# Patient Record
Sex: Female | Born: 1960 | Race: White | Hispanic: No | Marital: Single | Smoking: Former smoker
Health system: Southern US, Community
[De-identification: ages and names within clinical notes are randomized; demographics above are authoritative.]

## PROBLEM LIST (undated history)

## (undated) DIAGNOSIS — F329 Major depressive disorder, single episode, unspecified: Secondary | ICD-10-CM

## (undated) DIAGNOSIS — F419 Anxiety disorder, unspecified: Secondary | ICD-10-CM

## (undated) DIAGNOSIS — F32A Depression, unspecified: Secondary | ICD-10-CM

## (undated) DIAGNOSIS — Z803 Family history of malignant neoplasm of breast: Secondary | ICD-10-CM

## (undated) DIAGNOSIS — M1990 Unspecified osteoarthritis, unspecified site: Secondary | ICD-10-CM

## (undated) DIAGNOSIS — K635 Polyp of colon: Secondary | ICD-10-CM

## (undated) DIAGNOSIS — Z8051 Family history of malignant neoplasm of kidney: Secondary | ICD-10-CM

## (undated) HISTORY — PX: DIAGNOSTIC LAPAROSCOPY: SUR761

## (undated) HISTORY — PX: WISDOM TOOTH EXTRACTION: SHX21

## (undated) HISTORY — PX: TONSILLECTOMY: SUR1361

## (undated) HISTORY — DX: Family history of malignant neoplasm of breast: Z80.3

## (undated) HISTORY — DX: Family history of malignant neoplasm of kidney: Z80.51

## (undated) HISTORY — DX: Polyp of colon: K63.5

## (undated) HISTORY — PX: APPENDECTOMY: SHX54

## (undated) HISTORY — PX: SHOULDER ARTHROSCOPY: SHX128

---

## 2003-02-10 ENCOUNTER — Encounter (INDEPENDENT_AMBULATORY_CARE_PROVIDER_SITE_OTHER): Payer: Self-pay | Admitting: *Deleted

## 2003-02-10 ENCOUNTER — Ambulatory Visit (HOSPITAL_COMMUNITY): Admission: RE | Admit: 2003-02-10 | Discharge: 2003-02-10 | Payer: Self-pay | Admitting: *Deleted

## 2008-06-25 ENCOUNTER — Encounter: Admission: RE | Admit: 2008-06-25 | Discharge: 2008-06-25 | Payer: Self-pay | Admitting: Orthopedic Surgery

## 2009-08-29 HISTORY — PX: OTHER SURGICAL HISTORY: SHX169

## 2011-02-13 ENCOUNTER — Emergency Department (HOSPITAL_COMMUNITY)
Admission: EM | Admit: 2011-02-13 | Discharge: 2011-02-14 | Disposition: A | Discharge status: Other Acute Inpatient Hospital | Payer: BC Managed Care – PPO | Attending: Emergency Medicine | Admitting: Emergency Medicine

## 2011-02-13 DIAGNOSIS — F3289 Other specified depressive episodes: Secondary | ICD-10-CM | POA: Insufficient documentation

## 2011-02-13 DIAGNOSIS — T24339A Burn of third degree of unspecified lower leg, initial encounter: Secondary | ICD-10-CM | POA: Insufficient documentation

## 2011-02-13 DIAGNOSIS — X088XXA Exposure to other specified smoke, fire and flames, initial encounter: Secondary | ICD-10-CM | POA: Insufficient documentation

## 2011-02-13 DIAGNOSIS — M7989 Other specified soft tissue disorders: Secondary | ICD-10-CM | POA: Insufficient documentation

## 2011-02-13 DIAGNOSIS — M79609 Pain in unspecified limb: Secondary | ICD-10-CM | POA: Insufficient documentation

## 2011-02-13 DIAGNOSIS — F329 Major depressive disorder, single episode, unspecified: Secondary | ICD-10-CM | POA: Insufficient documentation

## 2011-02-14 LAB — CBC
HCT: 34.1 % — ABNORMAL LOW (ref 36.0–46.0)
MCV: 82.4 fL (ref 78.0–100.0)
Platelets: 331 10*3/uL (ref 150–400)
RBC: 4.14 MIL/uL (ref 3.87–5.11)
RDW: 12.7 % (ref 11.5–15.5)
WBC: 10.6 10*3/uL — ABNORMAL HIGH (ref 4.0–10.5)

## 2011-02-14 LAB — DIFFERENTIAL
Basophils Absolute: 0 10*3/uL (ref 0.0–0.1)
Basophils Relative: 0 % (ref 0–1)
Lymphocytes Relative: 17 % (ref 12–46)
Lymphs Abs: 1.8 10*3/uL (ref 0.7–4.0)

## 2011-02-14 LAB — COMPREHENSIVE METABOLIC PANEL
ALT: 47 U/L — ABNORMAL HIGH (ref 0–35)
AST: 31 U/L (ref 0–37)
Albumin: 3 g/dL — ABNORMAL LOW (ref 3.5–5.2)
Alkaline Phosphatase: 123 U/L — ABNORMAL HIGH (ref 39–117)
BUN: 15 mg/dL (ref 6–23)
CO2: 26 mEq/L (ref 19–32)
Total Bilirubin: 0.2 mg/dL — ABNORMAL LOW (ref 0.3–1.2)
Total Protein: 6.7 g/dL (ref 6.0–8.3)

## 2012-02-20 ENCOUNTER — Encounter (HOSPITAL_COMMUNITY): Payer: Self-pay | Admitting: Pharmacy Technician

## 2012-02-28 ENCOUNTER — Other Ambulatory Visit (HOSPITAL_COMMUNITY): Payer: BC Managed Care – PPO

## 2012-02-29 ENCOUNTER — Encounter (HOSPITAL_COMMUNITY): Payer: Self-pay

## 2012-02-29 ENCOUNTER — Encounter (HOSPITAL_COMMUNITY)
Admission: RE | Admit: 2012-02-29 | Discharge: 2012-02-29 | Disposition: A | Discharge status: Home / Self Care | Payer: BC Managed Care – PPO | Source: Ambulatory Visit | Attending: Obstetrics | Admitting: Obstetrics

## 2012-02-29 HISTORY — DX: Depression, unspecified: F32.A

## 2012-02-29 HISTORY — DX: Major depressive disorder, single episode, unspecified: F32.9

## 2012-02-29 LAB — CBC
Hemoglobin: 12.2 g/dL (ref 12.0–15.0)
MCH: 28.5 pg (ref 26.0–34.0)
Platelets: 251 10*3/uL (ref 150–400)
WBC: 8.3 10*3/uL (ref 4.0–10.5)

## 2012-02-29 NOTE — Patient Instructions (Addendum)
YOUR PROCEDURE IS SCHEDULED ON:03/06/12  ENTER THROUGH THE MAIN ENTRANCE OF [facility] AT: 6am  USE DESK PHONE AND DIAL [phone] TO INFORM us OF YOUR ARRIVAL  CALL [phone] IF YOU HAVE ANY QUESTIONS OR PROBLEMS PRIOR TO YOUR ARRIVAL.  REMEMBER: DO NOT EAT OR DRINK AFTER MIDNIGHT :Monday   YOU MAY BRUSH YOUR TEETH THE MORNING OF SURGERY   TAKE THESE MEDICINES THE DAY OF SURGERY WITH SIP OF WATER:none   DO NOT WEAR JEWELRY, EYE MAKEUP, LIPSTICK OR DARK FINGERNAIL POLISH DO NOT WEAR LOTIONS  DO NOT SHAVE FOR 48 HOURS PRIOR TO SURGERY  YOU WILL NOT BE ALLOWED TO DRIVE YOURSELF HOME.

## 2012-03-02 ENCOUNTER — Other Ambulatory Visit: Payer: Self-pay | Admitting: Obstetrics

## 2012-03-06 ENCOUNTER — Encounter (HOSPITAL_COMMUNITY): Payer: Self-pay | Admitting: Anesthesiology

## 2012-03-06 ENCOUNTER — Encounter (HOSPITAL_COMMUNITY): Payer: Self-pay | Admitting: *Deleted

## 2012-03-06 ENCOUNTER — Encounter (HOSPITAL_COMMUNITY)
Admission: RE | Disposition: A | Discharge status: Home / Self Care | Payer: Self-pay | Source: Ambulatory Visit | Attending: Obstetrics

## 2012-03-06 ENCOUNTER — Ambulatory Visit (HOSPITAL_COMMUNITY)
Admission: RE | Admit: 2012-03-06 | Discharge: 2012-03-06 | Disposition: A | Discharge status: Home / Self Care | Payer: BC Managed Care – PPO | Source: Ambulatory Visit | Attending: Obstetrics | Admitting: Obstetrics

## 2012-03-06 ENCOUNTER — Ambulatory Visit (HOSPITAL_COMMUNITY): Payer: BC Managed Care – PPO | Admitting: Anesthesiology

## 2012-03-06 DIAGNOSIS — Z01812 Encounter for preprocedural laboratory examination: Secondary | ICD-10-CM | POA: Insufficient documentation

## 2012-03-06 DIAGNOSIS — N393 Stress incontinence (female) (male): Secondary | ICD-10-CM | POA: Insufficient documentation

## 2012-03-06 DIAGNOSIS — N3641 Hypermobility of urethra: Secondary | ICD-10-CM | POA: Insufficient documentation

## 2012-03-06 DIAGNOSIS — Z01818 Encounter for other preprocedural examination: Secondary | ICD-10-CM | POA: Insufficient documentation

## 2012-03-06 HISTORY — PX: CYSTOSCOPY: SHX5120

## 2012-03-06 HISTORY — PX: BLADDER SUSPENSION: SHX72

## 2012-03-06 SURGERY — TRANSVAGINAL TAPE (TVT) PROCEDURE
Anesthesia: General | Site: Urethra | Wound class: Clean Contaminated

## 2012-03-06 MED ORDER — LIDOCAINE HCL (CARDIAC) 20 MG/ML IV SOLN
INTRAVENOUS | Status: AC
  Filled 2012-03-06: qty 5

## 2012-03-06 MED ORDER — STERILE WATER FOR IRRIGATION IR SOLN
Status: DC | PRN
  Administered 2012-03-06: 1000 mL via INTRAVESICAL

## 2012-03-06 MED ORDER — SODIUM CHLORIDE 0.9 % IJ SOLN
INTRAMUSCULAR | Status: DC | PRN
  Administered 2012-03-06: 50 mL

## 2012-03-06 MED ORDER — FENTANYL CITRATE 0.05 MG/ML IJ SOLN
INTRAMUSCULAR | Status: AC
  Filled 2012-03-06: qty 2

## 2012-03-06 MED ORDER — MEPERIDINE HCL 25 MG/ML IJ SOLN: 6.2500 mg | INTRAMUSCULAR | Status: DC | PRN

## 2012-03-06 MED ORDER — NITROFURANTOIN MONOHYD MACRO 100 MG PO CAPS: 100.0000 mg | ORAL_CAPSULE | Freq: Every day | ORAL | Status: AC

## 2012-03-06 MED ORDER — INDIGOTINDISULFONATE SODIUM 8 MG/ML IJ SOLN
INTRAMUSCULAR | Status: AC
  Filled 2012-03-06: qty 5

## 2012-03-06 MED ORDER — GLYCOPYRROLATE 0.2 MG/ML IJ SOLN
INTRAMUSCULAR | Status: AC
  Filled 2012-03-06: qty 1

## 2012-03-06 MED ORDER — PROPOFOL 10 MG/ML IV EMUL
INTRAVENOUS | Status: DC | PRN
  Administered 2012-03-06: 180 mg via INTRAVENOUS

## 2012-03-06 MED ORDER — DEXAMETHASONE SODIUM PHOSPHATE 4 MG/ML IJ SOLN
INTRAMUSCULAR | Status: DC | PRN
  Administered 2012-03-06: 10 mg via INTRAVENOUS

## 2012-03-06 MED ORDER — ONDANSETRON HCL 4 MG/2ML IJ SOLN
INTRAMUSCULAR | Status: AC
  Filled 2012-03-06: qty 2

## 2012-03-06 MED ORDER — PROMETHAZINE HCL 25 MG/ML IJ SOLN: 6.2500 mg | INTRAMUSCULAR | Status: DC | PRN

## 2012-03-06 MED ORDER — IBUPROFEN 600 MG PO TABS: 600.0000 mg | ORAL_TABLET | Freq: Four times a day (QID) | ORAL | Status: DC | PRN

## 2012-03-06 MED ORDER — OXYCODONE-ACETAMINOPHEN 5-325 MG PO TABS: 1.0000 | ORAL_TABLET | ORAL | Status: DC | PRN

## 2012-03-06 MED ORDER — MIDAZOLAM HCL 2 MG/2ML IJ SOLN
INTRAMUSCULAR | Status: AC
  Filled 2012-03-06: qty 2

## 2012-03-06 MED ORDER — CEFAZOLIN SODIUM-DEXTROSE 2-3 GM-% IV SOLR
INTRAVENOUS | Status: AC
  Administered 2012-03-06: 2 g via INTRAVENOUS
  Filled 2012-03-06: qty 50

## 2012-03-06 MED ORDER — MIDAZOLAM HCL 2 MG/2ML IJ SOLN: 0.5000 mg | Freq: Once | INTRAMUSCULAR | Status: DC | PRN

## 2012-03-06 MED ORDER — ZOLPIDEM TARTRATE 5 MG PO TABS: 5.0000 mg | ORAL_TABLET | Freq: Every evening | ORAL | Status: DC | PRN

## 2012-03-06 MED ORDER — SODIUM CHLORIDE 0.9 % IJ SOLN
INTRAMUSCULAR | Status: DC | PRN
  Administered 2012-03-06: 10 mL

## 2012-03-06 MED ORDER — GLYCOPYRROLATE 0.2 MG/ML IJ SOLN
INTRAMUSCULAR | Status: DC | PRN
  Administered 2012-03-06 (×2): 0.1 mg via INTRAVENOUS

## 2012-03-06 MED ORDER — FENTANYL CITRATE 0.05 MG/ML IJ SOLN: 25.0000 ug | INTRAMUSCULAR | Status: DC | PRN

## 2012-03-06 MED ORDER — ACETAMINOPHEN 325 MG PO TABS: 325.0000 mg | ORAL_TABLET | ORAL | Status: DC | PRN

## 2012-03-06 MED ORDER — IBUPROFEN 200 MG PO TABS: 800.0000 mg | ORAL_TABLET | Freq: Three times a day (TID) | ORAL | Status: AC | PRN

## 2012-03-06 MED ORDER — MIDAZOLAM HCL 5 MG/5ML IJ SOLN
INTRAMUSCULAR | Status: DC | PRN
  Administered 2012-03-06: 2 mg via INTRAVENOUS

## 2012-03-06 MED ORDER — FENTANYL CITRATE 0.05 MG/ML IJ SOLN
INTRAMUSCULAR | Status: DC | PRN
  Administered 2012-03-06: 100 ug via INTRAVENOUS

## 2012-03-06 MED ORDER — ONDANSETRON HCL 4 MG/2ML IJ SOLN: 4.0000 mg | Freq: Four times a day (QID) | INTRAMUSCULAR | Status: DC | PRN

## 2012-03-06 MED ORDER — LACTATED RINGERS IV SOLN
INTRAVENOUS | Status: DC
  Administered 2012-03-06: 125 mL/h via INTRAVENOUS
  Administered 2012-03-06: 08:00:00 via INTRAVENOUS

## 2012-03-06 MED ORDER — BUPIVACAINE-EPINEPHRINE 0.5% -1:200000 IJ SOLN
INTRAMUSCULAR | Status: DC | PRN
  Administered 2012-03-06: 30 mL

## 2012-03-06 MED ORDER — OXYCODONE-ACETAMINOPHEN 5-325 MG PO TABS: 2.0000 | ORAL_TABLET | Freq: Four times a day (QID) | ORAL | Status: AC | PRN

## 2012-03-06 MED ORDER — HYDROMORPHONE HCL PF 1 MG/ML IJ SOLN
1.0000 mg | INTRAMUSCULAR | Status: DC | PRN
  Administered 2012-03-06: 1 mg via INTRAVENOUS
  Filled 2012-03-06: qty 1

## 2012-03-06 MED ORDER — DEXAMETHASONE SODIUM PHOSPHATE 10 MG/ML IJ SOLN
INTRAMUSCULAR | Status: AC
  Filled 2012-03-06: qty 1

## 2012-03-06 MED ORDER — HEMOSTATIC AGENTS (NO CHARGE) OPTIME
TOPICAL | Status: DC | PRN
  Administered 2012-03-06: 1 via TOPICAL

## 2012-03-06 MED ORDER — BACITRACIN ZINC 500 UNIT/GM EX OINT
TOPICAL_OINTMENT | CUTANEOUS | Status: AC
  Filled 2012-03-06: qty 15

## 2012-03-06 MED ORDER — LIDOCAINE HCL (CARDIAC) 20 MG/ML IV SOLN
INTRAVENOUS | Status: DC | PRN
  Administered 2012-03-06: 80 mg via INTRAVENOUS

## 2012-03-06 MED ORDER — MENTHOL 3 MG MT LOZG: 1.0000 | LOZENGE | OROMUCOSAL | Status: DC | PRN

## 2012-03-06 MED ORDER — SODIUM CHLORIDE 0.9 % IV SOLN
INTRAVENOUS | Status: DC
  Administered 2012-03-06: 13:00:00 via INTRAVENOUS

## 2012-03-06 MED ORDER — BACITRACIN ZINC 500 UNIT/GM EX OINT
TOPICAL_OINTMENT | CUTANEOUS | Status: DC | PRN
  Administered 2012-03-06: 1 via TOPICAL

## 2012-03-06 MED ORDER — PANTOPRAZOLE SODIUM 40 MG PO TBEC
40.0000 mg | DELAYED_RELEASE_TABLET | Freq: Every day | ORAL | Status: DC
  Filled 2012-03-06 (×2): qty 1

## 2012-03-06 MED ORDER — GELATIN ABSORBABLE 12-7 MM EX MISC
CUTANEOUS | Status: AC
  Filled 2012-03-06: qty 1

## 2012-03-06 MED ORDER — BUPIVACAINE-EPINEPHRINE (PF) 0.5% -1:200000 IJ SOLN
INTRAMUSCULAR | Status: AC
  Filled 2012-03-06: qty 10

## 2012-03-06 MED ORDER — KETOROLAC TROMETHAMINE 30 MG/ML IJ SOLN: 15.0000 mg | Freq: Once | INTRAMUSCULAR | Status: DC | PRN

## 2012-03-06 MED ORDER — ONDANSETRON HCL 4 MG/2ML IJ SOLN
INTRAMUSCULAR | Status: DC | PRN
  Administered 2012-03-06: 4 mg via INTRAVENOUS

## 2012-03-06 MED ORDER — ONDANSETRON HCL 4 MG PO TABS: 4.0000 mg | ORAL_TABLET | Freq: Four times a day (QID) | ORAL | Status: DC | PRN

## 2012-03-06 MED ORDER — PROPOFOL 10 MG/ML IV EMUL
INTRAVENOUS | Status: AC
  Filled 2012-03-06: qty 20

## 2012-03-06 SURGICAL SUPPLY — 37 items
BLADE SURG 15 STRL LF C SS BP (BLADE) ×2 IMPLANT
BLADE SURG 15 STRL SS (BLADE) ×1
CANISTER SUCTION 2500CC (MISCELLANEOUS) ×3 IMPLANT
CATH FOLEY 3WAY 30CC 18FR (CATHETERS) IMPLANT
CLOTH BEACON ORANGE TIMEOUT ST (SAFETY) ×3 IMPLANT
COUNTER NEEDLE 1200 MAGNETIC (NEEDLE) ×3 IMPLANT
DECANTER SPIKE VIAL GLASS SM (MISCELLANEOUS) ×3 IMPLANT
DERMABOND ADHESIVE PROPEN (GAUZE/BANDAGES/DRESSINGS) ×1
DERMABOND ADVANCED (GAUZE/BANDAGES/DRESSINGS) ×2
DERMABOND ADVANCED .7 DNX12 (GAUZE/BANDAGES/DRESSINGS) ×4 IMPLANT
DERMABOND ADVANCED .7 DNX6 (GAUZE/BANDAGES/DRESSINGS) ×2 IMPLANT
DRAPE HYSTEROSCOPY (DRAPE) ×3 IMPLANT
GAUZE PACKING 1 X5 YD ST (GAUZE/BANDAGES/DRESSINGS) ×3 IMPLANT
GAUZE PACKING IODOFORM 2 (PACKING) IMPLANT
GLOVE BIO SURGEON STRL SZ 6.5 (GLOVE) ×3 IMPLANT
GLOVE BIOGEL PI IND STRL 6.5 (GLOVE) ×2 IMPLANT
GLOVE BIOGEL PI IND STRL 7.0 (GLOVE) ×4 IMPLANT
GLOVE BIOGEL PI INDICATOR 6.5 (GLOVE) ×1
GLOVE BIOGEL PI INDICATOR 7.0 (GLOVE) ×2
GOWN PREVENTION PLUS LG XLONG (DISPOSABLE) ×12 IMPLANT
NEEDLE HYPO 22GX1.5 SAFETY (NEEDLE) ×3 IMPLANT
NEEDLE SPNL 20GX3.5 QUINCKE YW (NEEDLE) ×3 IMPLANT
NS IRRIG 1000ML POUR BTL (IV SOLUTION) ×3 IMPLANT
PACK VAGINAL MINOR WOMEN LF (CUSTOM PROCEDURE TRAY) ×3 IMPLANT
PACK VAGINAL WOMENS (CUSTOM PROCEDURE TRAY) IMPLANT
PLUG CATH AND CAP STER (CATHETERS) ×3 IMPLANT
SET CYSTO W/LG BORE CLAMP LF (SET/KITS/TRAYS/PACK) ×3 IMPLANT
SLING TRANS VAGINAL TAPE (Sling) ×1 IMPLANT
SLING UTERINE/ABD GYNECARE TVT (Sling) ×2 IMPLANT
SUT VIC AB 2-0 CT1 27 (SUTURE) ×1
SUT VIC AB 2-0 CT1 TAPERPNT 27 (SUTURE) ×2 IMPLANT
SUT VIC AB 2-0 UR5 27 (SUTURE) ×3 IMPLANT
SYR BULB IRRIGATION 50ML (SYRINGE) ×3 IMPLANT
SYRINGE 10CC LL (SYRINGE) IMPLANT
TOWEL OR 17X24 6PK STRL BLUE (TOWEL DISPOSABLE) ×6 IMPLANT
TRAY FOLEY BAG SILVER LF 16FR (CATHETERS) ×3 IMPLANT
WATER STERILE IRR 1000ML POUR (IV SOLUTION) ×3 IMPLANT

## 2012-03-06 NOTE — Op Note (Signed)
Preoperative diagnosis: Stress urinary incontinence, urethral hypermobility  Postoperative diagnosis: stress urinary incontinence, urethral hypermobility Procedure tension-free vaginal tape sling placement, retropubic placement, cystoscopy Surgeon: Ernestina Penna Assistant none Anesthesia Gen. endotracheal anesthesia Antibiotics 1 g Ancef Complications: None Estimated blood loss: 40 cc Findings: Hypermobile urethra,  normal vaginal mucosa, normal bladder and urethra, no sling material seen in bladder or urethra post procedure  Indications: History and urodynamics c/w stress urinary incontinence, No prolapse.  Patient has been counseled on the risk and benefits of this procedure including the risk of urinary retention (and possibly needing to go home with a catheter), risk of sling erosion, immediate surgical and anaesthesia risks,  and possibly worsening or causing de-novo urge incontinence. As stress incontinence is her main complaint,  patient consented to TVT sling.  Procedure: After informed consent was obtained from the patient she was taken to the operating room where general anesthesia was initiated without difficulty. The mons pubis was shaved, the pubic symphysis was marked for the planned bilateral exit points, 2 cm lateral to the midline just above the a pubic symphysis.  A solution of 30 cc of 0.5% marcaine with 1:200,000 units of epinephrine mixed with 60 cc of normal saline was used as the injection. 30 cc of this solution was injected into the suprapubic space bilaterally, an additional 10 cc of this solution was injected along the planned exit routes bilaterally. This placement was accomplished by placing a 20-gauge spinal needle from the planned abdominal exit points behind the pubic symphysis and into the retropubic space. This space was confirmed with the use of the hand in the vagina to feel the needle tip prior to aspirating and injecting. A Foley catheter was then placed to drain the  bladder the balloon was inflated and used to help identify the bladder and to plan the sling placement in the mid urethral position. An Allis clamp was placed 2 cm below the urethral meatus; a second Allis clamp was placed 2 cm below that. 10 cc of the injecting solution were placed between the 2 Allis clamps with some lateral spread. A #15 blade was used to incise between the Allis clamps and Metzenbaum scissors were used to dissect the vaginal mucosa laterally. Allis clamps were placed on the vaginal edges to help accomplish this dissection. The dissection was taken to just below the level of the pubic symphysis. Once the planned entry routes were dissected a rigid Foley was placed into the bladder. The rigid Foley was deviated towards the patient's right knee. With the anesthesiologist pointing out the patient's right shoulder so that I could aim to the midclavicular line, the trocar with trocar sleeve and sling attached was inserted into the pre-dissected space and was pushed under the pubic symphysis. With dropping the trocar handle towards the ground and sharply rotating the trocar to the abdominal wall I was able to guide the trocar out the abdominal wall at the planned exit point. A Sharmain Lastra clamp was placed on the trocar sleeve the trocar was removed through the vagina. The sling tape was assessed and straightened and the trocar was placed in the opposite trocar sleeve. Again with retracting the dissected vaginal mucosa and placing the trocar in the planned left sided entry route, along with deviating the rigid Foley to the patient's left knee and aiming towards the left midclavicular line the trocar was passed under the pubic symphysis sharply rotated out of the abdominal wall and out the planned exit route. At this point cystoscopy was carried out.  The bladder was filled to 400 cc and evaluation was done of the entire bladder mucosa and the urethra to ensure no sling material was eroding through the bladder  mucosa.  Using a Babcock clamp to protect a small knuckle of sling material in the midpoint of the sling just under the urethra, the sling was pulled out through the abdominal entry points. Proper tensioning was assured with the use of a Babcock clamp and once I was assured a tension-free placement Leroy Pettway clamps were placed on the plastic sheaths at the abdominal exit points and the abdominal sheaths were removed. At this point a Tanja Port was released and a tension-free placement was seen.   Some bleeding was noted from the sling tract on the left. Pressure was placed for several minutes and hemostasis improved. No active bleeding vessel could be identified. A 1/2 of a small piece of gelfoam was placed in each sling tract (each side). Good hemostasis was noted.  The vaginal mucosa was closed with 2-0 Vicryl. The vagina was then packed with a vaseline soaked gauze roll.  The patient tolerated the procedure well. Sponge, lap and needle counts were correct x3 and the patient was taken to the recovery room in stable condition.  Catlin Aycock A. 03/06/2012 8:54 AM

## 2012-03-06 NOTE — Anesthesia Preprocedure Evaluation (Addendum)
Anesthesia Evaluation  Patient identified by MRN, date of birth, ID band Patient awake    Reviewed: Allergy & Precautions, H&P , Patient's Chart, lab work & pertinent test results, reviewed documented beta blocker date and time   History of Anesthesia Complications Negative for: history of anesthetic complications  Airway Mallampati: III TM Distance: >3 FB Neck ROM: full    Dental No notable dental hx.    Pulmonary neg pulmonary ROS,  breath sounds clear to auscultation  Pulmonary exam normal       Cardiovascular Exercise Tolerance: Good negative cardio ROS  Rhythm:regular Rate:Normal     Neuro/Psych PSYCHIATRIC DISORDERS Depression negative neurological ROS  negative psych ROS   GI/Hepatic negative GI ROS, Neg liver ROS,   Endo/Other  negative endocrine ROS  Renal/GU negative Renal ROS     Musculoskeletal   Abdominal   Peds  Hematology negative hematology ROS (+)   Anesthesia Other Findings   Reproductive/Obstetrics negative OB ROS                          Anesthesia Physical Anesthesia Plan  ASA: II  Anesthesia Plan: General LMA   Post-op Pain Management:    Induction:   Airway Management Planned:   Additional Equipment:   Intra-op Plan:   Post-operative Plan:   Informed Consent: I have reviewed the patients History and Physical, chart, labs and discussed the procedure including the risks, benefits and alternatives for the proposed anesthesia with the patient or authorized representative who has indicated his/her understanding and acceptance.   Dental Advisory Given  Plan Discussed with: CRNA, Surgeon and Anesthesiologist  Anesthesia Plan Comments:         Anesthesia Quick Evaluation

## 2012-03-06 NOTE — OR Nursing (Signed)
1610 TVT exact sling implanted @ midurethral position in OR suite per Dr. Ernestina Penna. Serial # [id number]. Exp. 10/27/12.

## 2012-03-06 NOTE — Transfer of Care (Signed)
Immediate Anesthesia Transfer of Care Note  Patient: Jaclyn Knight  Procedure(s) Performed: Procedure(s) (LRB): TRANSVAGINAL TAPE (TVT) PROCEDURE (N/A) CYSTOSCOPY (N/A)  Patient Location: PACU  Anesthesia Type: General  Level of Consciousness: awake and alert   Airway & Oxygen Therapy: Patient Spontanous Breathing  Post-op Assessment: Report given to PACU RN and Post -op Vital signs reviewed and stable  Post vital signs: Reviewed and stable  Complications: No apparent anesthesia complications

## 2012-03-06 NOTE — OR Nursing (Signed)
Dermabond to small puncture sites @ bilat. Perineum.

## 2012-03-06 NOTE — Anesthesia Procedure Notes (Signed)
Procedure Name: LMA Insertion Date/Time: 03/06/2012 7:33 AM Performed by: Keria Widrig, Jannet Askew Pre-anesthesia Checklist: Patient identified, Timeout performed, Emergency Drugs available, Suction available and Patient being monitored Patient Re-evaluated:Patient Re-evaluated prior to inductionOxygen Delivery Method: Circle system utilized Preoxygenation: Pre-oxygenation with 100% oxygen Intubation Type: IV induction Ventilation: Mask ventilation without difficulty LMA: LMA inserted LMA Size: 4.0 Number of attempts: 1 Tube secured with: taped at lips. Dental Injury: Teeth and Oropharynx as per pre-operative assessment

## 2012-03-06 NOTE — H&P (Signed)
See scanned docs for full H&P  In short 51 yo pt w/ stress incontinence documented by urodynamics who has failed conservative management and now presents for TVT sling.   No change to H&P since last note.  Abd: obese, soft,  Gen: well appearing Gu: def to OR LE: NT, no edema  A/P: Proceed as planned- TVT/ cystoscopy, pt aware of surgical risks.  Shaketta Rill A. 03/06/2012 7:23 AM

## 2012-03-06 NOTE — Brief Op Note (Signed)
03/06/2012  8:51 AM  PATIENT:  Jaclyn Knight  51 y.o. female  PRE-OPERATIVE DIAGNOSIS:  Stress Urinary Incontinence  POST-OPERATIVE DIAGNOSIS:  Stress Urinary Incontinence  PROCEDURE:  Procedure(s) (LRB): TRANSVAGINAL TAPE (TVT) PROCEDURE (N/A) CYSTOSCOPY (N/A)  SURGEON:  Surgeon(s) and Role:    Tresa Endo A. Ernestina Penna, MD - Primary  PHYSICIAN ASSISTANT:   ASSISTANTS: none   ANESTHESIA:   general  EBL:  Total I/O In: 1400 [I.V.:1400] Out: 900 [Urine:700; Blood:200]  BLOOD ADMINISTERED:none  DRAINS: Urinary Catheter (Foley)   LOCAL MEDICATIONS USED:  MARCAINE     SPECIMEN:  No Specimen  DISPOSITION OF SPECIMEN:  N/A  COUNTS:  YES  TOURNIQUET:  * No tourniquets in log *  DICTATION: .Note written in EPIC  PLAN OF CARE: extended observation, d/c home later today  PATIENT DISPOSITION:  PACU - hemodynamically stable.   Delay start of Pharmacological VTE agent (>24hrs) due to surgical blood loss or risk of bleeding: yes

## 2012-03-06 NOTE — Anesthesia Postprocedure Evaluation (Signed)
  Anesthesia Post-op Note  Patient: Jaclyn Knight  Procedure(s) Performed: Procedure(s) (LRB): TRANSVAGINAL TAPE (TVT) PROCEDURE (N/A) CYSTOSCOPY (N/A)  Patient Location: PACU  Anesthesia Type: General  Level of Consciousness: awake, alert  and oriented  Airway and Oxygen Therapy: Patient Spontanous Breathing  Post-op Pain: mild  Post-op Assessment: Post-op Vital signs reviewed, Patient's Cardiovascular Status Stable, Respiratory Function Stable, Patent Airway, No signs of Nausea or vomiting and Pain level controlled  Post-op Vital Signs: Reviewed and stable  Complications: No apparent anesthesia complications

## 2012-03-06 NOTE — Brief Op Note (Signed)
03/06/2012  8:53 AM  PATIENT:  Jaclyn Knight  51 y.o. female  PRE-OPERATIVE DIAGNOSIS:  Stress Urinary Incontinence  POST-OPERATIVE DIAGNOSIS:  Stress Urinary Incontinence  PROCEDURE:  Procedure(s) (LRB): TRANSVAGINAL TAPE (TVT) PROCEDURE (N/A) CYSTOSCOPY (N/A)  SURGEON:  Surgeon(s) and Role:    Tresa Endo A. Ernestina Penna, MD - Primary  PHYSICIAN ASSISTANT:   ASSISTANTS: none   ANESTHESIA:   general  EBL:  Total I/O In: 1400 [I.V.:1400] Out: 900 [Urine:700; Blood:200]  BLOOD ADMINISTERED:none  DRAINS: Urinary Catheter (Foley)   LOCAL MEDICATIONS USED:  MARCAINE     SPECIMEN:  No Specimen  DISPOSITION OF SPECIMEN:  N/A  COUNTS:  YES  TOURNIQUET:  * No tourniquets in log *  DICTATION: .Note written in EPIC  PLAN OF CARE: extended observatoin  PATIENT DISPOSITION:  PACU - hemodynamically stable.   Delay start of Pharmacological VTE agent (>24hrs) due to surgical blood loss or risk of bleeding: yes

## 2012-03-06 NOTE — Progress Notes (Signed)
Pt out in wheelchair  Teaching  Complete   

## 2012-03-07 ENCOUNTER — Encounter (HOSPITAL_COMMUNITY): Payer: Self-pay | Admitting: Obstetrics

## 2012-09-26 ENCOUNTER — Emergency Department (HOSPITAL_COMMUNITY)
Admission: EM | Admit: 2012-09-26 | Discharge: 2012-09-26 | Disposition: A | Discharge status: Home / Self Care | Payer: BC Managed Care – PPO | Attending: Emergency Medicine | Admitting: Emergency Medicine

## 2012-09-26 ENCOUNTER — Emergency Department (HOSPITAL_COMMUNITY): Payer: BC Managed Care – PPO

## 2012-09-26 ENCOUNTER — Encounter (HOSPITAL_COMMUNITY): Payer: Self-pay

## 2012-09-26 DIAGNOSIS — Z91041 Radiographic dye allergy status: Secondary | ICD-10-CM

## 2012-09-26 DIAGNOSIS — R071 Chest pain on breathing: Secondary | ICD-10-CM | POA: Insufficient documentation

## 2012-09-26 DIAGNOSIS — R6889 Other general symptoms and signs: Secondary | ICD-10-CM | POA: Insufficient documentation

## 2012-09-26 DIAGNOSIS — F329 Major depressive disorder, single episode, unspecified: Secondary | ICD-10-CM | POA: Insufficient documentation

## 2012-09-26 DIAGNOSIS — Z79899 Other long term (current) drug therapy: Secondary | ICD-10-CM | POA: Insufficient documentation

## 2012-09-26 DIAGNOSIS — F3289 Other specified depressive episodes: Secondary | ICD-10-CM | POA: Insufficient documentation

## 2012-09-26 DIAGNOSIS — R05 Cough: Secondary | ICD-10-CM | POA: Insufficient documentation

## 2012-09-26 DIAGNOSIS — R059 Cough, unspecified: Secondary | ICD-10-CM | POA: Insufficient documentation

## 2012-09-26 DIAGNOSIS — R079 Chest pain, unspecified: Secondary | ICD-10-CM

## 2012-09-26 DIAGNOSIS — F411 Generalized anxiety disorder: Secondary | ICD-10-CM | POA: Insufficient documentation

## 2012-09-26 DIAGNOSIS — J40 Bronchitis, not specified as acute or chronic: Secondary | ICD-10-CM | POA: Insufficient documentation

## 2012-09-26 DIAGNOSIS — Z87891 Personal history of nicotine dependence: Secondary | ICD-10-CM | POA: Insufficient documentation

## 2012-09-26 HISTORY — DX: Anxiety disorder, unspecified: F41.9

## 2012-09-26 LAB — POCT I-STAT, CHEM 8
Chloride: 103 mEq/L (ref 96–112)
Creatinine, Ser: 0.8 mg/dL (ref 0.50–1.10)
Glucose, Bld: 109 mg/dL — ABNORMAL HIGH (ref 70–99)
Hemoglobin: 12.6 g/dL (ref 12.0–15.0)
Potassium: 4.2 mEq/L (ref 3.5–5.1)
Sodium: 137 mEq/L (ref 135–145)

## 2012-09-26 MED ORDER — BENZONATATE 100 MG PO CAPS: 200.0000 mg | ORAL_CAPSULE | Freq: Three times a day (TID) | ORAL | Status: DC | PRN

## 2012-09-26 MED ORDER — METHYLPREDNISOLONE SODIUM SUCC 125 MG IJ SOLR
INTRAMUSCULAR | Status: AC
  Administered 2012-09-26: 125 mg
  Filled 2012-09-26: qty 2

## 2012-09-26 MED ORDER — FAMOTIDINE IN NACL 20-0.9 MG/50ML-% IV SOLN
20.0000 mg | Freq: Once | INTRAVENOUS | Status: AC
  Administered 2012-09-26: 20 mg via INTRAVENOUS
  Filled 2012-09-26: qty 50

## 2012-09-26 MED ORDER — PREDNISONE 10 MG PO TABS: ORAL_TABLET | ORAL | Status: DC

## 2012-09-26 MED ORDER — ALBUTEROL SULFATE (5 MG/ML) 0.5% IN NEBU
5.0000 mg | INHALATION_SOLUTION | Freq: Once | RESPIRATORY_TRACT | Status: AC
  Administered 2012-09-26: 5 mg via RESPIRATORY_TRACT
  Filled 2012-09-26: qty 1

## 2012-09-26 MED ORDER — DIPHENHYDRAMINE HCL 50 MG/ML IJ SOLN
INTRAMUSCULAR | Status: AC
  Administered 2012-09-26: 50 mg
  Filled 2012-09-26: qty 1

## 2012-09-26 MED ORDER — IOHEXOL 350 MG/ML SOLN
100.0000 mL | Freq: Once | INTRAVENOUS | Status: AC | PRN
  Administered 2012-09-26: 100 mL via INTRAVENOUS

## 2012-09-26 MED ORDER — EPINEPHRINE HCL 1 MG/ML IJ SOLN
INTRAMUSCULAR | Status: AC
  Filled 2012-09-26: qty 1

## 2012-09-26 NOTE — ED Notes (Signed)
MD at bedside. 

## 2012-09-26 NOTE — ED Notes (Signed)
Patient transported to CT 

## 2012-09-26 NOTE — ED Notes (Signed)
Pt states that she is feeling better.  Whelps are noted to be diminished on pt's back.  States that the itching has stopped.  No distress noted at the present.  Pt appears comfortable.

## 2012-09-26 NOTE — ED Notes (Signed)
Pt states she was sent over from MD office for elevated D-dimer. SOB, cough and CP for 4 weeks.

## 2012-09-26 NOTE — ED Provider Notes (Addendum)
History     CSN: [id number]  Arrival date & time 09/26/12  4098   First MD Initiated Contact with Patient 09/26/12 [phone]      Chief Complaint  Patient presents with  . Abnormal Lab    (Consider location/radiation/quality/duration/timing/severity/associated sxs/prior treatment) HPI Comments: Jaclyn Knight is a 52 y.o. Female who presents for evaluation of left chest pain. The pain has been present for 1 month, and  has waxed and waned during that time. It got better when she took prednisone. It has worsened in the last week. Her cough is mostly nonproductive. She denies nausea, vomiting, weakness, or dizziness. She becomes very short of breath, and coughing. She occasionally gags and cannot breathe at all. She is using the prescribed medicines by her doctor, but has not improved. There are no other modifying factors. She does not use estrogen products. She is an ex-smoker.  The history is provided by the patient.    Past Medical History  Diagnosis Date  . Depression   . Anxiety     Past Surgical History  Procedure Date  . Skin grafts   . Appendectomy   . Diagnostic laparoscopy   . Shoulder arthroscopy   . Bladder suspension 03/06/2012    Procedure: TRANSVAGINAL TAPE (TVT) PROCEDURE;  Surgeon: Tresa Endo A. Ernestina Penna, MD;  Location: [facility];  Service: Gynecology;  Laterality: N/A;  Transvaginal Tape Procedure @ midurethral position  . Cystoscopy 03/06/2012    Procedure: CYSTOSCOPY;  Surgeon: Alphonsus Sias. Ernestina Penna, MD;  Location: [facility];  Service: Gynecology;  Laterality: N/A;  . Tonsillectomy     No family history on file.  History  Substance Use Topics  . Smoking status: Former Smoker -- 0.5 packs/day for 7 years    Types: Cigarettes    Quit date: 08/29/1996  . Smokeless tobacco: Never Used  . Alcohol Use: No    OB History    Grav Para Term Preterm Abortions TAB SAB Ect Mult Living                  Review of Systems  All other systems reviewed and are  negative.    Allergies  Contrast media  Home Medications   Current Outpatient Rx  Name  Route  Sig  Dispense  Refill  . BENZONATATE 100 MG PO CAPS   Oral   Take 100 mg by mouth 3 (three) times daily as needed. For cough         . DESVENLAFAXINE SUCCINATE ER 100 MG PO TB24   Oral   Take 100 mg by mouth every morning.         Marland Kitchen HYDROCODONE-ACETAMINOPHEN 5-325 MG PO TABS   Oral   Take 1 tablet by mouth every 6 (six) hours as needed. For pain         . BENZONATATE 100 MG PO CAPS   Oral   Take 2 capsules (200 mg total) by mouth 3 (three) times daily as needed for cough.   60 capsule   0   . PREDNISONE 10 MG PO TABS      Taper; take q day 6,5,4,3,2,1   21 tablet   0     BP 122/69  Pulse 89  Temp 98.8 F (37.1 C) (Oral)  Resp 19  Ht 5\' 6"  (1.676 m)  Wt 201 lb (91.173 kg)  BMI 32.44 kg/m2  SpO2 95%  LMP 08/05/2011  Physical Exam  Nursing note and vitals reviewed. Constitutional: She is oriented to person, place,  and time. She appears well-developed and well-nourished.  HENT:  Head: Normocephalic and atraumatic.  Eyes: Conjunctivae normal and EOM are normal. Pupils are equal, round, and reactive to light.  Neck: Normal range of motion and phonation normal. Neck supple.  Cardiovascular: Normal rate, regular rhythm and intact distal pulses.   Pulmonary/Chest: Effort normal and breath sounds normal. She exhibits no tenderness.       She has pain in left chest wall, with deep inspiration  Abdominal: Soft. She exhibits no distension. There is no tenderness. There is no guarding.  Musculoskeletal: Normal range of motion.  Neurological: She is alert and oriented to person, place, and time. She has normal strength. She exhibits normal muscle tone.  Skin: Skin is warm and dry.  Psychiatric: She has a normal mood and affect. Her behavior is normal. Judgment and thought content normal.    ED Course  Procedures (including critical care time)  Initial evaluation,  and treatment: CT chest angiogram to rule out pulmonary embolus.  The patient returned from CT, in distress with itching and generalized rash. Blood pressure did not drop. She was treated aggressively for allergic reaction to IV contrast material.  Emergency department treatment: IVF, Solu-Medrol, IV, IV, Benadryl, IV Pepcid and nebulizer.   Reevaluation posttreatment: Wheezing, and rash, resolved. Vital signs stabilized normal.  CRITICAL CARE Performed by: Mancel Bale L   Total critical care time: 35 minutes  Critical care time was exclusive of separately billable procedures and treating other patients.  Critical care was necessary to treat or prevent imminent or life-threatening deterioration.  Critical care was time spent personally by me on the following activities: development of treatment plan with patient and/or surrogate as well as nursing, discussions with consultants, evaluation of patient's response to treatment, examination of patient, obtaining history from patient or surrogate, ordering and performing treatments and interventions, ordering and review of laboratory studies, ordering and review of radiographic studies, pulse oximetry and re-evaluation of patient's condition.    Labs Reviewed  POCT I-STAT, CHEM 8 - Abnormal; Notable for the following:    Glucose, Bld 109 (*)     All other components within normal limits   Ct Angio Chest Pe W/cm &/or Wo Cm  09/26/2012  *RADIOLOGY REPORT*  Clinical Data: Shortness of breath with cough and chest pain.  CT ANGIOGRAPHY CHEST  Technique:  Multidetector CT imaging of the chest using the standard protocol during bolus administration of intravenous contrast. Multiplanar reconstructed images including MIPs were obtained and reviewed to evaluate the vascular anatomy.  Contrast: OMNIPAQUE IOHEXOL 350 MG/ML SOLN  Comparison: None.  Findings: There is no filling defect in the opacified pulmonary arteries to suggest the presence of  an acute pulmonary embolus.  No thoracic aortic aneurysm.  No dissection of the thoracic aorta.  No axillary, mediastinal, or hilar lymphadenopathy.  Heart size is at upper normal.  There is no pericardial or pleural effusion.  Compressive atelectasis is seen in the dependent lower lobes bilaterally.  No focal airspace consolidation.  3 mm right middle lobe pulmonary nodule is visualized on image 43.  Bone windows reveal no worrisome lytic or sclerotic osseous lesions.  IMPRESSION: No CT evidence for acute pulmonary embolus.  No thoracic aortic aneurysm or dissection.  Compressive atelectasis and both dependent lung bases.  3 mm right middle lobe pulmonary nodule. If the patient is at high risk for bronchogenic carcinoma, follow-up chest CT at 1 year is recommended.  If the patient is at low risk, no follow-up is  needed.  This recommendation follows the consensus statement: Guidelines for Management of Small Pulmonary Nodules Detected on CT Scans:  A Statement from the Fleischner Society as published in Radiology 2005; 237:395-400.   Original Report Authenticated By: Kennith Center, M.D.    Nursing notes, applicable records and vitals reviewed.  Radiologic Images/Reports reviewed.   1. Cough   2. Bronchitis   3. Chest pain, unspecified   4. Allergic to IV contrast       MDM  Nonspecific chest pain, most likely due to bronchitis. Doubt pneumonia, PE, lung cancer, ACS, or occult infection. Patient improved, with treatment in the ED. Incidental finding of IV contrast dye allergy. No anaphylaxis. She is stable for discharge with outpatient management     Plan: Home Medications- prednisone, Tessalon; Home Treatments- rest; Recommended follow up- PCP, one week for checkup     Flint Melter, MD 09/26/12 1747  Flint Melter, MD 09/26/12 1758

## 2012-09-26 NOTE — ED Notes (Signed)
Called to room.  Pt states that she is having trouble breathing.  Whelps noted to pt's back.  No wheezing noted.  Swelling noted to eyes.  Dr. Effie Shy notified immediately.  Orders given.

## 2012-10-03 ENCOUNTER — Ambulatory Visit (INDEPENDENT_AMBULATORY_CARE_PROVIDER_SITE_OTHER): Payer: BC Managed Care – PPO | Admitting: Internal Medicine

## 2012-10-03 ENCOUNTER — Encounter: Payer: Self-pay | Admitting: *Deleted

## 2012-10-03 ENCOUNTER — Encounter: Payer: Self-pay | Admitting: Internal Medicine

## 2012-10-03 VITALS — BP 120/70 | HR 88 | Temp 98.3°F | Ht 66.0 in | Wt 209.2 lb

## 2012-10-03 DIAGNOSIS — R911 Solitary pulmonary nodule: Secondary | ICD-10-CM

## 2012-10-03 DIAGNOSIS — R059 Cough, unspecified: Secondary | ICD-10-CM

## 2012-10-03 DIAGNOSIS — R05 Cough: Secondary | ICD-10-CM

## 2012-10-03 MED ORDER — ACAPELLA MISC: Status: DC

## 2012-10-03 MED ORDER — PANTOPRAZOLE SODIUM 40 MG PO TBEC: 40.0000 mg | DELAYED_RELEASE_TABLET | Freq: Every day | ORAL | Status: DC

## 2012-10-03 MED ORDER — PREDNISONE (PAK) 10 MG PO TABS: ORAL_TABLET | ORAL | Status: DC

## 2012-10-03 MED ORDER — FAMOTIDINE 20 MG PO TABS: ORAL_TABLET | ORAL | Status: DC

## 2012-10-03 NOTE — Patient Instructions (Addendum)
The key to effective treatment for your cough is eliminating the non-stop cycle of cough you're stuck in long enough to let your airway heal completely and then see if there is anything still making you cough once you stop the cough suppression, but this should take no more than 5 days to figure out  First take delsym two tsp every 12 hours and supplement if needed with  tramadol 50 mg up to 2 every 4 hours to suppress the urge to cough. Swallowing water or using ice chips/non mint and menthol containing candies (such as lifesavers or sugarless jolly ranchers) are also effective.  You should rest your voice and avoid activities that you know make you cough.  Once you have eliminated the cough for 3 straight days try reducing the tramadol first,  then the delsym as tolerated.    Try pantoprazole Take 30-60 min before first meal of the day and Pepcid 20 mg one bedtime until cough is completely gone for at least a week without the need for cough suppression    GERD (REFLUX)  is an extremely common cause of respiratory symptoms, many times with no significant heartburn at all.    It can be treated with medication, but also with lifestyle changes including avoidance of late meals, excessive alcohol, smoking cessation, and avoid fatty foods, chocolate, peppermint, colas, red wine, and acidic juices such as orange juice.  NO MINT OR MENTHOL PRODUCTS SO NO COUGH DROPS  USE SUGARLESS CANDY INSTEAD (jolley ranchers or Stover's)  NO OIL BASED VITAMINS - use powdered substitutes.    Prednisone 10 mg take  4 each am x 2 days,   2 each am x 2 days,  1 each am x2days and stop    Late add: placed in tickle file for recall in one year for spn 4 mm

## 2012-10-03 NOTE — Progress Notes (Signed)
  Subjective:    Patient ID: Jaclyn Knight, female    DOB: 18-Jan-1961   MRN: [id number]  HPI  51 yowf quit smoking around 2000 and no resp problems then started pattern of recurrent yearly "bronchitis" starting w/in a few years of smoking cessation self referred 10/03/2012 to pulmonary clinic for refractory cough.  10/03/2012 1st pulmonary eval cc acute onset around xmas 2013 rx mid jan with flonase, zpak but worse cough and sob so then rx prednisone then vicodin added for pain L lat chest with cough, never fever or nasty mucus,  Never vomited, Pos cough micturition despite pelvic sling.  Started flovent 2/4 > worse cough  No obvious daytime variabilty or assoc excess mucus or cp or chest tightness, subjective wheeze overt sinus or hb symptoms. No unusual exp hx or h/o childhood pna/ asthma or premature birth to her knowledge.    Sleeping ok without nocturnal  or early am exacerbation  of respiratory  c/o's or need for noct saba. Also denies any obvious fluctuation of symptoms with weather or environmental changes or other aggravating or alleviating factors except as outlined above    Review of Systems  Constitutional: Negative for fever and unexpected weight change.  HENT: Negative for ear pain, nosebleeds, congestion, sore throat, rhinorrhea, sneezing, trouble swallowing, dental problem, postnasal drip and sinus pressure.   Eyes: Negative for redness and itching.  Respiratory: Positive for cough, shortness of breath and wheezing. Negative for chest tightness.   Cardiovascular: Negative for palpitations and leg swelling.  Gastrointestinal: Negative for nausea and vomiting.  Genitourinary: Negative for dysuria.  Musculoskeletal: Negative for joint swelling.  Skin: Negative for rash.  Neurological: Negative for headaches.  Hematological: Does not bruise/bleed easily.  Psychiatric/Behavioral: Positive for dysphoric mood. The patient is nervous/anxious.        Objective:   Physical  Exam  Amb wf nad Wt Readings from Last 3 Encounters:  10/03/12 209 lb 3.2 oz (94.892 kg)  09/26/12 201 lb (91.173 kg)  03/06/12 208 lb (94.348 kg)    HEENT: nl dentition, turbinates, and orophanx. Nl external ear canals without cough reflex   NECK :  without JVD/Nodes/TM/ nl carotid upstrokes bilaterally   LUNGS: no acc muscle use, clear to A and P bilaterally without cough on insp or exp maneuvers   CV:  RRR  no s3 or murmur or increase in P2, no edema   ABD:  soft and nontender with nl excursion in the supine position. No bruits or organomegaly, bowel sounds nl  MS:  warm without deformities, calf tenderness, cyanosis or clubbing  SKIN: warm and dry without lesions    NEURO:  alert, approp, no deficits    Ct chest 09/26/12 No CT evidence for acute pulmonary embolus. No thoracic aortic  aneurysm or dissection.  Compressive atelectasis and both dependent lung bases.  3 mm right middle lobe pulmonary nodule. If the patient is at high  risk for bronchogenic carcinoma, follow-up chest CT at 1 year is  recommended.           Assessment & Plan:

## 2012-10-04 ENCOUNTER — Telehealth: Payer: Self-pay | Admitting: Internal Medicine

## 2012-10-04 DIAGNOSIS — R05 Cough: Secondary | ICD-10-CM | POA: Insufficient documentation

## 2012-10-04 DIAGNOSIS — R911 Solitary pulmonary nodule: Secondary | ICD-10-CM | POA: Insufficient documentation

## 2012-10-04 DIAGNOSIS — R059 Cough, unspecified: Secondary | ICD-10-CM | POA: Insufficient documentation

## 2012-10-04 MED ORDER — TRAMADOL HCL 50 MG PO TABS: ORAL_TABLET | ORAL | Status: DC

## 2012-10-04 NOTE — Telephone Encounter (Signed)
Pt returned call. I advised her that rx had been called in. Nothing further needed at this time per pt. Jaclyn Knight

## 2012-10-04 NOTE — Assessment & Plan Note (Signed)
-   See CT chest 09/24/12 > placed in tickle for recall 09/24/13  Jaclyn Knight criteria reviewed, agree since she smoked needs f/u in one year, advised

## 2012-10-04 NOTE — Telephone Encounter (Signed)
Tramadol 50 mg #40 1-2  every 4 hours if needed

## 2012-10-04 NOTE — Assessment & Plan Note (Signed)
The most common causes of chronic cough in immunocompetent adults include the following: upper airway cough syndrome (UACS), previously referred to as postnasal drip syndrome (PNDS), which is caused by variety of rhinosinus conditions; (2) asthma; (3) GERD; (4) chronic bronchitis from cigarette smoking or other inhaled environmental irritants; (5) nonasthmatic eosinophilic bronchitis; and (6) bronchiectasis.   These conditions, singly or in combination, have accounted for up to 94% of the causes of chronic cough in prospective studies.   Other conditions have constituted no >6% of the causes in prospective studies These have included bronchogenic carcinoma, chronic interstitial pneumonia, sarcoidosis, left ventricular failure, ACEI-induced cough, and aspiration from a condition associated with pharyngeal dysfunction.    Chronic cough is often simultaneously caused by more than one condition. A single cause has been found from 38 to 82% of the time, multiple causes from 18 to 62%. Multiply caused cough has been the result of three diseases up to 42% of the time.       Of the three most common causes of chronic cough, only one (GERD)  can actually cause the other two (asthma and post nasal drip syndrome)  and perpetuate the cylce of cough inducing airway trauma, inflammation, heightened sensitivity to reflux which is prompted by the cough itself via a cyclical mechanism.    This may partially respond to steroids and look like asthma and post nasal drainage but never erradicated completely unless the cough and the secondary reflux are eliminated, preferably both at the same time.  While not intuitively obvious, many patients with chronic low grade reflux do not cough until there is a secondary insult that disturbs the protective epithelial barrier and exposes sensitive nerve endings.  This can be viral or direct physical injury such as with an endotracheal tube.   The point is that once this occurs, it is  difficult to eliminate using anything but a maximally effective acid suppression regimen at least in the short run, accompanied by an appropriate diet to address non acid GERD.   If not better next step is sinus ct

## 2012-10-04 NOTE — Telephone Encounter (Signed)
LMOMTCB x1 for pt--rx has been sent

## 2012-10-04 NOTE — Telephone Encounter (Signed)
Please advise quantity and directions for pt tramadol Dr. Sherene Sires thanks

## 2012-10-08 ENCOUNTER — Encounter: Payer: Self-pay | Admitting: *Deleted

## 2012-10-08 ENCOUNTER — Telehealth: Payer: Self-pay | Admitting: Internal Medicine

## 2012-10-08 NOTE — Telephone Encounter (Signed)
Dr Sherene Sires, Please advise if it is okay to send the letter approving her to go back to work, thanks!

## 2012-10-08 NOTE — Telephone Encounter (Signed)
Letter and OV note were faxed to the number given Pt aware and states nothing further needed

## 2012-10-08 NOTE — Telephone Encounter (Signed)
ok 

## 2012-10-24 ENCOUNTER — Institutional Professional Consult (permissible substitution): Payer: BC Managed Care – PPO | Admitting: Pulmonary Disease

## 2013-07-04 ENCOUNTER — Other Ambulatory Visit: Payer: Self-pay

## 2013-09-24 ENCOUNTER — Telehealth: Payer: Self-pay | Admitting: *Deleted

## 2013-09-24 DIAGNOSIS — R911 Solitary pulmonary nodule: Secondary | ICD-10-CM

## 2013-09-24 NOTE — Telephone Encounter (Signed)
Spoke with the pt and reminded her of ct chest due  She verbalized understanding  [facility]

## 2013-09-24 NOTE — Telephone Encounter (Signed)
Message copied by Christen ButterASKIN, Madelein Mahadeo M on Tue Sep 24, 2013  9:45 AM ------      Message from: Sandrea HughsWERT, MICHAEL B      Created: Thu Oct 04, 2012  8:27 AM       Needs ct chest by now, no contrast ------

## 2013-09-24 NOTE — Telephone Encounter (Signed)
Message copied by Christen ButterASKIN, Kandie Keiper M on Tue Sep 24, 2013  9:44 AM ------      Message from: Sandrea HughsWERT, MICHAEL B      Created: Thu Oct 04, 2012  8:27 AM       Needs ct chest by now, no contrast ------

## 2013-10-02 ENCOUNTER — Encounter: Payer: Self-pay | Admitting: Internal Medicine

## 2013-10-02 ENCOUNTER — Ambulatory Visit (INDEPENDENT_AMBULATORY_CARE_PROVIDER_SITE_OTHER)
Admission: RE | Admit: 2013-10-02 | Discharge: 2013-10-02 | Disposition: A | Discharge status: Home / Self Care | Payer: BC Managed Care – PPO | Source: Ambulatory Visit | Attending: Internal Medicine | Admitting: Internal Medicine

## 2013-10-02 DIAGNOSIS — R911 Solitary pulmonary nodule: Secondary | ICD-10-CM

## 2014-05-27 ENCOUNTER — Other Ambulatory Visit: Payer: Self-pay | Admitting: Surgical

## 2014-07-23 NOTE — H&P (Signed)
TOTAL KNEE ADMISSION H&P  Patient is being admitted for left total knee arthroplasty.  Subjective:  Chief Complaint:left knee pain.  HPI: Jaclyn Knight, 53 y.o. female, has a history of pain and functional disability in the left knee due to trauma and arthritis and has failed non-surgical conservative treatments for greater than 12 weeks to includeNSAID's and/or analgesics, corticosteriod injections, viscosupplementation injections, supervised PT with diminished ADL's post treatment and activity modification.  Onset of symptoms was gradual, starting 2 years ago with gradually worsening course since that time. The patient noted prior procedures on the knee to include  arthroscopy and menisectomy on the left knee(s).  Patient currently rates pain in the left knee(s) at 8 out of 10 with activity. Patient has night pain, worsening of pain with activity and weight bearing, pain that interferes with activities of daily living, pain with passive range of motion, crepitus and joint swelling.  Patient has evidence of periarticular osteophytes and joint space narrowing by imaging studies. There is no active infection.  Patient Active Problem List   Diagnosis Date Noted  . Cough 10/04/2012  . Pulmonary nodule 10/04/2012   Past Medical History  Diagnosis Date  . Depression   . Anxiety     Past Surgical History  Procedure Laterality Date  . Skin grafts    . Appendectomy    . Diagnostic laparoscopy    . Shoulder arthroscopy    . Bladder suspension  03/06/2012    Procedure: TRANSVAGINAL TAPE (TVT) PROCEDURE;  Surgeon: Tresa EndoKelly A. Ernestina PennaFogleman, MD;  Location: [facility];  Service: Gynecology;  Laterality: N/A;  Transvaginal Tape Procedure @ midurethral position  . Cystoscopy  03/06/2012    Procedure: CYSTOSCOPY;  Surgeon: Alphonsus SiasKelly A. Ernestina PennaFogleman, MD;  Location: [facility];  Service: Gynecology;  Laterality: N/A;  . Tonsillectomy       Current outpatient prescriptions:  Naproxen 500mg  1-2 tabs daily PRN Bupropion HCI  XL 150 tabs daily Fluoxetine HCl 40mg  daily Venlafaxine HCl ER daily     Allergies  Allergen Reactions  . Contrast Media [Iodinated Diagnostic Agents] Shortness Of Breath    SOB,rash,hives. Improved w/benadyrl,solumedrol,pepcid.    History  Substance Use Topics  . Smoking status: Former Smoker -- 0.50 packs/day for 7 years    Types: Cigarettes    Quit date: 08/29/1996  . Smokeless tobacco: Never Used  . Alcohol Use: No    Family History  Problem Relation Age of Onset  . Allergic rhinitis      family history   . Lung cancer Father   . Kidney cancer Father   . Breast cancer Sister   . Breast cancer      AUNT ON MOM AND DADS SIDE.      Review of Systems  Constitutional: Positive for malaise/fatigue. Negative for fever, chills, weight loss and diaphoresis.  HENT: Negative.   Eyes: Negative.   Respiratory: Negative.   Gastrointestinal: Positive for blood in stool. Negative for heartburn, nausea, vomiting, abdominal pain, diarrhea, constipation and melena.  Genitourinary: Negative.   Musculoskeletal: Positive for myalgias and joint pain. Negative for back pain, falls and neck pain.       Left knee pain  Skin: Negative.   Neurological: Negative.  Negative for weakness.  Endo/Heme/Allergies: Positive for environmental allergies. Negative for polydipsia. Does not bruise/bleed easily.  Psychiatric/Behavioral: Positive for depression. Negative for suicidal ideas, hallucinations, memory loss and substance abuse. The patient is nervous/anxious. The patient does not have insomnia.     Objective:  Physical Exam  Constitutional: She is oriented to person, place, and time. She appears well-developed. No distress.  Obese  HENT:  Head: Normocephalic and atraumatic.  Right Ear: External ear normal.  Left Ear: External ear normal.  Nose: Nose normal.  Mouth/Throat: Oropharynx is clear and moist.  Eyes: Conjunctivae and EOM are normal.  Neck: Normal range of motion. Neck supple.   Cardiovascular: Normal rate, regular rhythm, normal heart sounds and intact distal pulses.   No murmur heard. Respiratory: Effort normal and breath sounds normal. No respiratory distress. She has no wheezes.  GI: Soft. Bowel sounds are normal. She exhibits no distension. There is no tenderness.  Musculoskeletal:       Right hip: Normal.       Left hip: Normal.       Right knee: Normal.       Left knee: She exhibits decreased range of motion and swelling. She exhibits no effusion and no erythema. Tenderness found. Medial joint line and lateral joint line tenderness noted.       Right lower leg: She exhibits no tenderness and no swelling.       Left lower leg: She exhibits no tenderness and no swelling.  Neurological: She is alert and oriented to person, place, and time. She has normal strength. No sensory deficit.  Skin: No rash noted. She is not diaphoretic. No erythema.  Psychiatric: She has a normal mood and affect. Her behavior is normal.    Vitals  Weight: 214 lb Height: 66in Body Surface Area: 2.06 m Body Mass Index: 34.54 kg/m  Pulse: 72 (Regular)  BP: 130/78 (Sitting, Left Arm, Standard)  Imaging Review Plain radiographs demonstrate severe degenerative joint disease of the left knee(s). The overall alignment ismild varus. The bone quality appears to be good for age and reported activity level.  Assessment/Plan:  End stage arthritis, left knee   The patient history, physical examination, clinical judgment of the provider and imaging studies are consistent with end stage degenerative joint disease of the left knee(s) and total knee arthroplasty is deemed medically necessary. The treatment options including medical management, injection therapy arthroscopy and arthroplasty were discussed at length. The risks and benefits of total knee arthroplasty were presented and reviewed. The risks due to aseptic loosening, infection, stiffness, patella tracking problems,  thromboembolic complications and other imponderables were discussed. The patient acknowledged the explanation, agreed to proceed with the plan and consent was signed. Patient is being admitted for inpatient treatment for surgery, pain control, PT, OT, prophylactic antibiotics, VTE prophylaxis, progressive ambulation and ADL's and discharge planning. The patient is planning to be discharged home with home health services   PCP: Dr. Sigmund HazelLisa Miller  TXA IV   Dimitri PedAmber Melanny Wire, PA-C

## 2014-07-30 ENCOUNTER — Other Ambulatory Visit (HOSPITAL_COMMUNITY): Payer: Self-pay | Admitting: *Deleted

## 2014-07-30 NOTE — Progress Notes (Signed)
Chest ct 10-02-13 epic

## 2014-07-30 NOTE — Patient Instructions (Addendum)
Jaclyn Knight  07/30/2014   Your procedure is scheduled on: Wednesday December 9th, 2015  Report to [facility]   Entrance and follow signs to               Short Stay Center at 630 AM.  Call this number if you have problems the morning of surgery [phone]   Remember:  Do not eat food or drink liquids :After Midnight.     Take these medicines the morning of surgery with A SIP OF WATER: Prozac, Wellbutrin             You may not have any metal on your body including hair pins and              piercings  Do not wear jewelry, make-up, lotions, powders or perfumes.             Do not wear nail polish.  Do not shave  48 hours prior to surgery.              Men may shave face and neck.   Do not bring valuables to the hospital. [place] IS NOT             RESPONSIBLE   FOR VALUABLES.  Contacts, dentures or bridgework may not be worn into surgery.  Leave suitcase in the car. After surgery it may be brought to your room.     Patients discharged the day of surgery will not be allowed to drive home.  Name and phone number of your driver:  Special Instructions: N/A              Please read over the following fact sheets you were given: _____________________________________________________________________             [facility] - Preparing for Surgery Before surgery, you can play an important role.  Because skin is not sterile, your skin needs to be as free of germs as possible.  You can reduce the number of germs on your skin by washing with CHG (chlorahexidine gluconate) soap before surgery.  CHG is an antiseptic cleaner which kills germs and bonds with the skin to continue killing germs even after washing. Please DO NOT use if you have an allergy to CHG or antibacterial soaps.  If your skin becomes reddened/irritated stop using the CHG and inform your nurse when you arrive at Short Stay. Do not shave (including legs and underarms) for at  least 48 hours prior to the first CHG shower.  You may shave your face/neck. Please follow these instructions carefully:  1.  Shower with CHG Soap the night before surgery and the  morning of Surgery.  2.  If you choose to wash your hair, wash your hair first as usual with your  normal  shampoo.  3.  After you shampoo, rinse your hair and body thoroughly to remove the  shampoo.                           4.  Use CHG as you would any other liquid soap.  You can apply chg directly  to the skin and wash                       Gently with a scrungie or clean washcloth.  5.  Apply the CHG Soap  to your body ONLY FROM THE NECK DOWN.   Do not use on face/ open                           Wound or open sores. Avoid contact with eyes, ears mouth and genitals (private parts).                       Wash face,  Genitals (private parts) with your normal soap.             6.  Wash thoroughly, paying special attention to the area where your surgery  will be performed.  7.  Thoroughly rinse your body with warm water from the neck down.  8.  DO NOT shower/wash with your normal soap after using and rinsing off  the CHG Soap.                9.  Pat yourself dry with a clean towel.            10.  Wear clean pajamas.            11.  Place clean sheets on your bed the night of your first shower and do not  sleep with pets. Day of Surgery : Do not apply any lotions/deodorants the morning of surgery.  Please wear clean clothes to the hospital/surgery center.  FAILURE TO FOLLOW THESE INSTRUCTIONS MAY RESULT IN THE CANCELLATION OF YOUR SURGERY PATIENT SIGNATURE_________________________________  NURSE SIGNATURE__________________________________  ________________________________________________________________________   Jaclyn Knight  An incentive spirometer is a tool that can help keep your lungs clear and active. This tool measures how well you are filling your lungs with each breath. Taking long deep breaths  may help reverse or decrease the chance of developing breathing (pulmonary) problems (especially infection) following:  A long period of time when you are unable to move or be active. BEFORE THE PROCEDURE   If the spirometer includes an indicator to show your best effort, your nurse or respiratory therapist will set it to a desired goal.  If possible, sit up straight or lean slightly forward. Try not to slouch.  Hold the incentive spirometer in an upright position. INSTRUCTIONS FOR USE   Sit on the edge of your bed if possible, or sit up as far as you can in bed or on a chair.  Hold the incentive spirometer in an upright position.  Breathe out normally.  Place the mouthpiece in your mouth and seal your lips tightly around it.  Breathe in slowly and as deeply as possible, raising the piston or the ball toward the top of the column.  Hold your breath for 3-5 seconds or for as long as possible. Allow the piston or ball to fall to the bottom of the column.  Remove the mouthpiece from your mouth and breathe out normally.  Rest for a few seconds and repeat Steps 1 through 7 at least 10 times every 1-2 hours when you are awake. Take your time and take a few normal breaths between deep breaths.  The spirometer may include an indicator to show your best effort. Use the indicator as a goal to work toward during each repetition.  After each set of 10 deep breaths, practice coughing to be sure your lungs are clear. If you have an incision (the cut made at the time of surgery), support your incision when coughing by placing a pillow or rolled up towels firmly  against it. Once you are able to get out of bed, walk around indoors and cough well. You may stop using the incentive spirometer when instructed by your caregiver.  RISKS AND COMPLICATIONS  Take your time so you do not get dizzy or light-headed.  If you are in pain, you may need to take or ask for pain medication before doing incentive  spirometry. It is harder to take a deep breath if you are having pain. AFTER USE  Rest and breathe slowly and easily.  It can be helpful to keep track of a log of your progress. Your caregiver can provide you with a simple table to help with this. If you are using the spirometer at home, follow these instructions: [place] IF:   You are having difficultly using the spirometer.  You have trouble using the spirometer as often as instructed.  Your pain medication is not giving enough relief while using the spirometer.  You develop fever of 100.5 F (38.1 C) or higher. SEEK IMMEDIATE MEDICAL CARE IF:   You cough up bloody sputum that had not been present before.  You develop fever of 102 F (38.9 C) or greater.  You develop worsening pain at or near the incision site. MAKE SURE YOU:   Understand these instructions.  Will watch your condition.  Will get help right away if you are not doing well or get worse. Document Released: 12/26/2006 Document Revised: 11/07/2011 Document Reviewed: 02/26/2007 ExitCare Patient Information 2014 ExitCare, [place].   ________________________________________________________________________  WHAT IS A BLOOD TRANSFUSION? Blood Transfusion Information  A transfusion is the replacement of blood or some of its parts. Blood is made up of multiple cells which provide different functions.  Red blood cells carry oxygen and are used for blood loss replacement.  White blood cells fight against infection.  Platelets control bleeding.  Plasma helps clot blood.  Other blood products are available for specialized needs, such as hemophilia or other clotting disorders. BEFORE THE TRANSFUSION  Who gives blood for transfusions?   Healthy volunteers who are fully evaluated to make sure their blood is safe. This is blood bank blood. Transfusion therapy is the safest it has ever been in the practice of medicine. Before blood is taken from a donor, a  complete history is taken to make sure that person has no history of diseases nor engages in risky social behavior (examples are intravenous drug use or sexual activity with multiple partners). The donor's travel history is screened to minimize risk of transmitting infections, such as malaria. The donated blood is tested for signs of infectious diseases, such as HIV and hepatitis. The blood is then tested to be sure it is compatible with you in order to minimize the chance of a transfusion reaction. If you or a relative donates blood, this is often done in anticipation of surgery and is not appropriate for emergency situations. It takes many days to process the donated blood. RISKS AND COMPLICATIONS Although transfusion therapy is very safe and saves many lives, the main dangers of transfusion include:   Getting an infectious disease.  Developing a transfusion reaction. This is an allergic reaction to something in the blood you were given. Every precaution is taken to prevent this. The decision to have a blood transfusion has been considered carefully by your caregiver before blood is given. Blood is not given unless the benefits outweigh the risks. AFTER THE TRANSFUSION  Right after receiving a blood transfusion, you will usually feel much better and  more energetic. This is especially true if your red blood cells have gotten low (anemic). The transfusion raises the level of the red blood cells which carry oxygen, and this usually causes an energy increase.  The nurse administering the transfusion will monitor you carefully for complications. HOME CARE INSTRUCTIONS  No special instructions are needed after a transfusion. You may find your energy is better. Speak with your caregiver about any limitations on activity for underlying diseases you may have. SEEK MEDICAL CARE IF:   Your condition is not improving after your transfusion.  You develop redness or irritation at the intravenous (IV)  site. SEEK IMMEDIATE MEDICAL CARE IF:  Any of the following symptoms occur over the next 12 hours:  Shaking chills.  You have a temperature by mouth above 102 F (38.9 C), not controlled by medicine.  Chest, back, or muscle pain.  People around you feel you are not acting correctly or are confused.  Shortness of breath or difficulty breathing.  Dizziness and fainting.  You get a rash or develop hives.  You have a decrease in urine output.  Your urine turns a dark color or changes to pink, red, or brown. Any of the following symptoms occur over the next 10 days:  You have a temperature by mouth above 102 F (38.9 C), not controlled by medicine.  Shortness of breath.  Weakness after normal activity.  The white part of the eye turns yellow (jaundice).  You have a decrease in the amount of urine or are urinating less often.  Your urine turns a dark color or changes to pink, red, or brown. Document Released: 08/12/2000 Document Revised: 11/07/2011 Document Reviewed: 03/31/2008 [facility] Patient Information 2014 [place], [place].  _______________________________________________________________________

## 2014-07-31 ENCOUNTER — Encounter (HOSPITAL_COMMUNITY)
Admission: RE | Admit: 2014-07-31 | Discharge: 2014-07-31 | Disposition: A | Discharge status: Home / Self Care | Payer: Worker's Compensation | Source: Ambulatory Visit | Attending: Orthopedic Surgery | Admitting: Orthopedic Surgery

## 2014-07-31 ENCOUNTER — Encounter (HOSPITAL_COMMUNITY): Payer: Self-pay

## 2014-07-31 DIAGNOSIS — Z22321 Carrier or suspected carrier of Methicillin susceptible Staphylococcus aureus: Secondary | ICD-10-CM | POA: Diagnosis not present

## 2014-07-31 DIAGNOSIS — Z01818 Encounter for other preprocedural examination: Secondary | ICD-10-CM | POA: Diagnosis not present

## 2014-07-31 HISTORY — DX: Unspecified osteoarthritis, unspecified site: M19.90

## 2014-07-31 LAB — COMPREHENSIVE METABOLIC PANEL
ALT: 27 U/L (ref 0–35)
AST: 17 U/L (ref 0–37)
Albumin: 4.2 g/dL (ref 3.5–5.2)
Alkaline Phosphatase: 88 U/L (ref 39–117)
Anion gap: 15 (ref 5–15)
BUN: 14 mg/dL (ref 6–23)
CO2: 24 mEq/L (ref 19–32)
Calcium: 9.8 mg/dL (ref 8.4–10.5)
Chloride: 101 mEq/L (ref 96–112)
Creatinine, Ser: 0.69 mg/dL (ref 0.50–1.10)
GFR calc Af Amer: >90 mL/min (ref >90)
GFR calc non Af Amer: >90 mL/min (ref >90)
Glucose, Bld: 120 mg/dL — ABNORMAL HIGH (ref 70–99)
Potassium: 4 mEq/L (ref 3.7–5.3)
Sodium: 140 mEq/L (ref 137–147)
Total Bilirubin: 0.4 mg/dL (ref 0.3–1.2)
Total Protein: 7.8 g/dL (ref 6.0–8.3)

## 2014-07-31 LAB — CBC
HCT: 39 % (ref 36.0–46.0)
Hemoglobin: 13.4 g/dL (ref 12.0–15.0)
MCH: 29 pg (ref 26.0–34.0)
MCHC: 34.4 g/dL (ref 30.0–36.0)
MCV: 84.4 fL (ref 78.0–100.0)
Platelets: 289 10*3/uL (ref 150–400)
RBC: 4.62 MIL/uL (ref 3.87–5.11)
RDW: 13.1 % (ref 11.5–15.5)
WBC: 7.3 10*3/uL (ref 4.0–10.5)

## 2014-07-31 LAB — URINALYSIS, ROUTINE W REFLEX MICROSCOPIC
Bilirubin Urine: NEGATIVE
Glucose, UA: NEGATIVE mg/dL
Hgb urine dipstick: NEGATIVE
Ketones, ur: NEGATIVE mg/dL
Leukocytes, UA: NEGATIVE
Nitrite: NEGATIVE
Protein, ur: NEGATIVE mg/dL
Specific Gravity, Urine: 1.005 (ref 1.005–1.030)
Urobilinogen, UA: 0.2 mg/dL (ref 0.0–1.0)
pH: 6 (ref 5.0–8.0)

## 2014-07-31 LAB — HCG, SERUM, QUALITATIVE: Preg, Serum: NEGATIVE

## 2014-07-31 LAB — SURGICAL PCR SCREEN
MRSA, PCR: NEGATIVE
STAPHYLOCOCCUS AUREUS: POSITIVE — AB

## 2014-07-31 LAB — PROTIME-INR
INR: 0.98 (ref 0.00–1.49)
Prothrombin Time: 13.1 seconds (ref 11.6–15.2)

## 2014-07-31 LAB — ABO/RH: ABO/RH(D): O POS

## 2014-07-31 LAB — APTT: aPTT: 33 seconds (ref 24–37)

## 2014-07-31 NOTE — Progress Notes (Signed)
07-31-14 1615 Positive Staph aureus PCR screen- Rx for Mupirocin called to [facility] -[phone], [address]- pt. Aware to use as directed. Fax message sent to Dr. Jeannetta EllisGioffre's office pod [phone].

## 2014-08-06 ENCOUNTER — Encounter (HOSPITAL_COMMUNITY): Payer: Self-pay | Admitting: *Deleted

## 2014-08-06 ENCOUNTER — Inpatient Hospital Stay (HOSPITAL_COMMUNITY): Payer: Worker's Compensation | Admitting: *Deleted

## 2014-08-06 ENCOUNTER — Encounter (HOSPITAL_COMMUNITY)
Admission: RE | Disposition: A | Discharge status: Home / Self Care | Payer: Self-pay | Source: Ambulatory Visit | Attending: Orthopedic Surgery

## 2014-08-06 ENCOUNTER — Inpatient Hospital Stay (HOSPITAL_COMMUNITY)
Admission: RE | Admit: 2014-08-06 | Discharge: 2014-08-09 | DRG: 470 | Disposition: A | Discharge status: Home / Self Care | Payer: Worker's Compensation | Source: Ambulatory Visit | Attending: Orthopedic Surgery | Admitting: Orthopedic Surgery

## 2014-08-06 DIAGNOSIS — D62 Acute posthemorrhagic anemia: Secondary | ICD-10-CM | POA: Diagnosis not present

## 2014-08-06 DIAGNOSIS — E669 Obesity, unspecified: Secondary | ICD-10-CM | POA: Diagnosis present

## 2014-08-06 DIAGNOSIS — F329 Major depressive disorder, single episode, unspecified: Secondary | ICD-10-CM | POA: Diagnosis present

## 2014-08-06 DIAGNOSIS — Z6835 Body mass index (BMI) 35.0-35.9, adult: Secondary | ICD-10-CM | POA: Diagnosis not present

## 2014-08-06 DIAGNOSIS — M1712 Unilateral primary osteoarthritis, left knee: Principal | ICD-10-CM | POA: Diagnosis present

## 2014-08-06 DIAGNOSIS — F419 Anxiety disorder, unspecified: Secondary | ICD-10-CM | POA: Diagnosis present

## 2014-08-06 DIAGNOSIS — M25562 Pain in left knee: Secondary | ICD-10-CM | POA: Diagnosis present

## 2014-08-06 DIAGNOSIS — Z87891 Personal history of nicotine dependence: Secondary | ICD-10-CM | POA: Diagnosis not present

## 2014-08-06 DIAGNOSIS — Z96659 Presence of unspecified artificial knee joint: Secondary | ICD-10-CM

## 2014-08-06 HISTORY — PX: TOTAL KNEE ARTHROPLASTY: SHX125

## 2014-08-06 LAB — TYPE AND SCREEN
ABO/RH(D): O POS
Antibody Screen: NEGATIVE

## 2014-08-06 SURGERY — ARTHROPLASTY, KNEE, TOTAL
Anesthesia: General | Site: Knee | Laterality: Left

## 2014-08-06 MED ORDER — ONDANSETRON HCL 4 MG/2ML IJ SOLN
4.0000 mg | Freq: Four times a day (QID) | INTRAMUSCULAR | Status: DC | PRN
  Administered 2014-08-06 – 2014-08-08 (×5): 4 mg via INTRAVENOUS
  Filled 2014-08-06 (×5): qty 2

## 2014-08-06 MED ORDER — EPHEDRINE SULFATE 50 MG/ML IJ SOLN
INTRAMUSCULAR | Status: DC | PRN
  Administered 2014-08-06: 5 mg via INTRAVENOUS

## 2014-08-06 MED ORDER — ACETAMINOPHEN 650 MG RE SUPP: 650.0000 mg | Freq: Four times a day (QID) | RECTAL | Status: DC | PRN

## 2014-08-06 MED ORDER — SODIUM CHLORIDE 0.9 % IR SOLN
Status: DC | PRN
  Administered 2014-08-06: 1000 mL

## 2014-08-06 MED ORDER — RIVAROXABAN 10 MG PO TABS
10.0000 mg | ORAL_TABLET | Freq: Every day | ORAL | Status: DC
  Administered 2014-08-07 – 2014-08-09 (×3): 10 mg via ORAL
  Filled 2014-08-06 (×5): qty 1

## 2014-08-06 MED ORDER — POLYETHYLENE GLYCOL 3350 17 G PO PACK: 17.0000 g | PACK | Freq: Every day | ORAL | Status: DC | PRN

## 2014-08-06 MED ORDER — METOCLOPRAMIDE HCL 5 MG/ML IJ SOLN
INTRAMUSCULAR | Status: DC | PRN
  Administered 2014-08-06: 10 mg via INTRAVENOUS

## 2014-08-06 MED ORDER — FERROUS SULFATE 325 (65 FE) MG PO TABS
325.0000 mg | ORAL_TABLET | Freq: Three times a day (TID) | ORAL | Status: DC
  Administered 2014-08-06 – 2014-08-09 (×5): 325 mg via ORAL
  Filled 2014-08-06 (×12): qty 1

## 2014-08-06 MED ORDER — ROCURONIUM BROMIDE 100 MG/10ML IV SOLN
INTRAVENOUS | Status: DC | PRN
  Administered 2014-08-06: 45 mg via INTRAVENOUS

## 2014-08-06 MED ORDER — HYDROMORPHONE HCL 1 MG/ML IJ SOLN
0.2500 mg | INTRAMUSCULAR | Status: DC | PRN
  Administered 2014-08-06 (×2): 0.5 mg via INTRAVENOUS

## 2014-08-06 MED ORDER — PROPOFOL 10 MG/ML IV BOLUS
INTRAVENOUS | Status: DC | PRN
  Administered 2014-08-06: 200 mg via INTRAVENOUS

## 2014-08-06 MED ORDER — METHOCARBAMOL 500 MG PO TABS
500.0000 mg | ORAL_TABLET | Freq: Four times a day (QID) | ORAL | Status: DC | PRN
  Administered 2014-08-07 – 2014-08-09 (×4): 500 mg via ORAL
  Filled 2014-08-06 (×4): qty 1

## 2014-08-06 MED ORDER — BUPIVACAINE HCL (PF) 0.25 % IJ SOLN
INTRAMUSCULAR | Status: AC
  Filled 2014-08-06: qty 30

## 2014-08-06 MED ORDER — SODIUM CHLORIDE 0.9 % IJ SOLN
INTRAMUSCULAR | Status: DC | PRN
  Administered 2014-08-06: 20 mL via INTRAVENOUS

## 2014-08-06 MED ORDER — LIDOCAINE HCL (CARDIAC) 20 MG/ML IV SOLN
INTRAVENOUS | Status: AC
  Filled 2014-08-06: qty 5

## 2014-08-06 MED ORDER — BUPROPION HCL ER (XL) 150 MG PO TB24
150.0000 mg | ORAL_TABLET | Freq: Every morning | ORAL | Status: DC
  Administered 2014-08-06 – 2014-08-09 (×4): 150 mg via ORAL
  Filled 2014-08-06 (×4): qty 1

## 2014-08-06 MED ORDER — TRANEXAMIC ACID 100 MG/ML IV SOLN
1000.0000 mg | INTRAVENOUS | Status: AC
  Administered 2014-08-06: 1000 mg via INTRAVENOUS
  Filled 2014-08-06: qty 10

## 2014-08-06 MED ORDER — FENTANYL CITRATE 0.05 MG/ML IJ SOLN
INTRAMUSCULAR | Status: AC
  Filled 2014-08-06: qty 5

## 2014-08-06 MED ORDER — CEFAZOLIN SODIUM-DEXTROSE 2-3 GM-% IV SOLR
2.0000 g | INTRAVENOUS | Status: AC
  Administered 2014-08-06: 2 g via INTRAVENOUS

## 2014-08-06 MED ORDER — HYDROMORPHONE HCL 1 MG/ML IJ SOLN
1.0000 mg | INTRAMUSCULAR | Status: DC | PRN
  Administered 2014-08-06 – 2014-08-07 (×3): 1 mg via INTRAVENOUS
  Filled 2014-08-06 (×3): qty 1

## 2014-08-06 MED ORDER — GLYCOPYRROLATE 0.2 MG/ML IJ SOLN
INTRAMUSCULAR | Status: DC | PRN
  Administered 2014-08-06: .3 mg via INTRAVENOUS

## 2014-08-06 MED ORDER — HYDRALAZINE HCL 20 MG/ML IJ SOLN
INTRAMUSCULAR | Status: DC | PRN
  Administered 2014-08-06: 5 mg via INTRAVENOUS
  Administered 2014-08-06: 2.5 mg via INTRAVENOUS

## 2014-08-06 MED ORDER — MIDAZOLAM HCL 2 MG/2ML IJ SOLN
INTRAMUSCULAR | Status: AC
  Filled 2014-08-06: qty 2

## 2014-08-06 MED ORDER — METOCLOPRAMIDE HCL 5 MG/ML IJ SOLN
INTRAMUSCULAR | Status: AC
  Filled 2014-08-06: qty 2

## 2014-08-06 MED ORDER — MIDAZOLAM HCL 5 MG/5ML IJ SOLN
INTRAMUSCULAR | Status: DC | PRN
  Administered 2014-08-06 (×2): 1 mg via INTRAVENOUS

## 2014-08-06 MED ORDER — PANTOPRAZOLE SODIUM 40 MG PO TBEC
40.0000 mg | DELAYED_RELEASE_TABLET | Freq: Every day | ORAL | Status: DC
  Administered 2014-08-06 – 2014-08-09 (×4): 40 mg via ORAL
  Filled 2014-08-06 (×4): qty 1

## 2014-08-06 MED ORDER — SODIUM CHLORIDE 0.9 % IR SOLN
Status: DC | PRN
  Administered 2014-08-06: 500 mL

## 2014-08-06 MED ORDER — METHOCARBAMOL 1000 MG/10ML IJ SOLN
500.0000 mg | Freq: Four times a day (QID) | INTRAVENOUS | Status: DC | PRN
  Administered 2014-08-06: 500 mg via INTRAVENOUS
  Filled 2014-08-06 (×2): qty 5

## 2014-08-06 MED ORDER — BISACODYL 5 MG PO TBEC: 5.0000 mg | DELAYED_RELEASE_TABLET | Freq: Every day | ORAL | Status: DC | PRN

## 2014-08-06 MED ORDER — BUPIVACAINE LIPOSOME 1.3 % IJ SUSP
20.0000 mL | Freq: Once | INTRAMUSCULAR | Status: AC
  Administered 2014-08-06: 20 mL
  Filled 2014-08-06: qty 20

## 2014-08-06 MED ORDER — GLYCOPYRROLATE 0.2 MG/ML IJ SOLN
INTRAMUSCULAR | Status: AC
  Filled 2014-08-06: qty 2

## 2014-08-06 MED ORDER — LACTATED RINGERS IV SOLN
INTRAVENOUS | Status: DC
  Administered 2014-08-06 – 2014-08-07 (×2): via INTRAVENOUS

## 2014-08-06 MED ORDER — NEOSTIGMINE METHYLSULFATE 10 MG/10ML IV SOLN
INTRAVENOUS | Status: AC
  Filled 2014-08-06: qty 1

## 2014-08-06 MED ORDER — CEFAZOLIN SODIUM 1-5 GM-% IV SOLN
1.0000 g | Freq: Four times a day (QID) | INTRAVENOUS | Status: AC
  Administered 2014-08-06 (×2): 1 g via INTRAVENOUS
  Filled 2014-08-06 (×2): qty 50

## 2014-08-06 MED ORDER — FLUOXETINE HCL 20 MG PO CAPS
40.0000 mg | ORAL_CAPSULE | Freq: Every morning | ORAL | Status: DC
  Administered 2014-08-07 – 2014-08-09 (×3): 40 mg via ORAL
  Filled 2014-08-06 (×3): qty 2

## 2014-08-06 MED ORDER — LIDOCAINE HCL (CARDIAC) 20 MG/ML IV SOLN
INTRAVENOUS | Status: DC | PRN
  Administered 2014-08-06: 60 mg via INTRAVENOUS

## 2014-08-06 MED ORDER — LACTATED RINGERS IV SOLN
INTRAVENOUS | Status: DC | PRN
  Administered 2014-08-06 (×3): via INTRAVENOUS

## 2014-08-06 MED ORDER — ALUM & MAG HYDROXIDE-SIMETH 200-200-20 MG/5ML PO SUSP
30.0000 mL | ORAL | Status: DC | PRN
  Administered 2014-08-08 – 2014-08-09 (×4): 30 mL via ORAL
  Filled 2014-08-06 (×4): qty 30

## 2014-08-06 MED ORDER — PROPOFOL 10 MG/ML IV BOLUS
INTRAVENOUS | Status: AC
  Filled 2014-08-06: qty 20

## 2014-08-06 MED ORDER — ONDANSETRON HCL 4 MG PO TABS
4.0000 mg | ORAL_TABLET | Freq: Four times a day (QID) | ORAL | Status: DC | PRN
  Administered 2014-08-07 – 2014-08-08 (×2): 4 mg via ORAL
  Filled 2014-08-06 (×2): qty 1

## 2014-08-06 MED ORDER — GLYCOPYRROLATE 0.2 MG/ML IJ SOLN
INTRAMUSCULAR | Status: AC
  Filled 2014-08-06: qty 3

## 2014-08-06 MED ORDER — NEOSTIGMINE METHYLSULFATE 10 MG/10ML IV SOLN
INTRAVENOUS | Status: DC | PRN
  Administered 2014-08-06: 2 mg via INTRAVENOUS

## 2014-08-06 MED ORDER — CELECOXIB 200 MG PO CAPS
200.0000 mg | ORAL_CAPSULE | Freq: Two times a day (BID) | ORAL | Status: DC
  Administered 2014-08-06 – 2014-08-09 (×7): 200 mg via ORAL
  Filled 2014-08-06 (×8): qty 1

## 2014-08-06 MED ORDER — HYDROMORPHONE HCL 1 MG/ML IJ SOLN
INTRAMUSCULAR | Status: AC
  Filled 2014-08-06: qty 1

## 2014-08-06 MED ORDER — DEXAMETHASONE SODIUM PHOSPHATE 10 MG/ML IJ SOLN
INTRAMUSCULAR | Status: AC
  Filled 2014-08-06: qty 1

## 2014-08-06 MED ORDER — ACETAMINOPHEN 325 MG PO TABS
650.0000 mg | ORAL_TABLET | Freq: Four times a day (QID) | ORAL | Status: DC | PRN
  Administered 2014-08-08 – 2014-08-09 (×2): 650 mg via ORAL
  Filled 2014-08-06 (×3): qty 2

## 2014-08-06 MED ORDER — ONDANSETRON HCL 4 MG/2ML IJ SOLN
INTRAMUSCULAR | Status: AC
Start: 2014-08-06 — End: 2014-08-06
  Filled 2014-08-06: qty 2

## 2014-08-06 MED ORDER — ONDANSETRON HCL 4 MG/2ML IJ SOLN
INTRAMUSCULAR | Status: DC | PRN
  Administered 2014-08-06: 4 mg via INTRAVENOUS

## 2014-08-06 MED ORDER — THROMBIN 5000 UNITS EX SOLR
OROMUCOSAL | Status: DC | PRN
  Administered 2014-08-06: 10:00:00 via TOPICAL

## 2014-08-06 MED ORDER — HYDRALAZINE HCL 20 MG/ML IJ SOLN
INTRAMUSCULAR | Status: AC
  Filled 2014-08-06: qty 1

## 2014-08-06 MED ORDER — HYDROMORPHONE HCL 1 MG/ML IJ SOLN
INTRAMUSCULAR | Status: DC | PRN
  Administered 2014-08-06: 1 mg via INTRAVENOUS
  Administered 2014-08-06 (×6): 0.5 mg via INTRAVENOUS

## 2014-08-06 MED ORDER — FENTANYL CITRATE 0.05 MG/ML IJ SOLN
INTRAMUSCULAR | Status: DC | PRN
  Administered 2014-08-06: 50 ug via INTRAVENOUS
  Administered 2014-08-06: 100 ug via INTRAVENOUS
  Administered 2014-08-06 (×7): 50 ug via INTRAVENOUS

## 2014-08-06 MED ORDER — HYDROCODONE-ACETAMINOPHEN 5-325 MG PO TABS: 1.0000 | ORAL_TABLET | ORAL | Status: DC | PRN

## 2014-08-06 MED ORDER — BUPIVACAINE HCL (PF) 0.25 % IJ SOLN
INTRAMUSCULAR | Status: DC | PRN
  Administered 2014-08-06: 20 mL

## 2014-08-06 MED ORDER — OXYCODONE-ACETAMINOPHEN 5-325 MG PO TABS
2.0000 | ORAL_TABLET | ORAL | Status: DC | PRN
Start: 2014-08-06 — End: 2014-08-08
  Administered 2014-08-06 – 2014-08-07 (×3): 2 via ORAL
  Administered 2014-08-07: 1 via ORAL
  Administered 2014-08-07 – 2014-08-08 (×4): 2 via ORAL
  Filled 2014-08-06 (×8): qty 2

## 2014-08-06 MED ORDER — HYDROMORPHONE HCL 2 MG/ML IJ SOLN
INTRAMUSCULAR | Status: AC
  Filled 2014-08-06: qty 1

## 2014-08-06 MED ORDER — ROCURONIUM BROMIDE 100 MG/10ML IV SOLN
INTRAVENOUS | Status: AC
  Filled 2014-08-06: qty 1

## 2014-08-06 MED ORDER — PHENOL 1.4 % MT LIQD
1.0000 | OROMUCOSAL | Status: DC | PRN
  Filled 2014-08-06: qty 177

## 2014-08-06 MED ORDER — THROMBIN 5000 UNITS EX SOLR
CUTANEOUS | Status: AC
  Filled 2014-08-06: qty 5000

## 2014-08-06 MED ORDER — SODIUM CHLORIDE 0.9 % IR SOLN
Status: AC
  Filled 2014-08-06: qty 1

## 2014-08-06 MED ORDER — PROMETHAZINE HCL 25 MG/ML IJ SOLN: 6.2500 mg | INTRAMUSCULAR | Status: DC | PRN

## 2014-08-06 MED ORDER — MENTHOL 3 MG MT LOZG
1.0000 | LOZENGE | OROMUCOSAL | Status: DC | PRN
  Filled 2014-08-06 (×2): qty 9

## 2014-08-06 MED ORDER — CEFAZOLIN SODIUM-DEXTROSE 2-3 GM-% IV SOLR
INTRAVENOUS | Status: AC
  Filled 2014-08-06: qty 50

## 2014-08-06 MED ORDER — SODIUM CHLORIDE 0.9 % IJ SOLN
INTRAMUSCULAR | Status: AC
  Filled 2014-08-06: qty 50

## 2014-08-06 MED ORDER — VENLAFAXINE HCL ER 75 MG PO CP24
75.0000 mg | ORAL_CAPSULE | Freq: Every morning | ORAL | Status: DC
  Administered 2014-08-07 – 2014-08-08 (×2): 75 mg via ORAL
  Filled 2014-08-06 (×4): qty 1

## 2014-08-06 MED ORDER — FLEET ENEMA 7-19 GM/118ML RE ENEM: 1.0000 | ENEMA | Freq: Once | RECTAL | Status: AC | PRN

## 2014-08-06 MED ORDER — DEXAMETHASONE SODIUM PHOSPHATE 10 MG/ML IJ SOLN
INTRAMUSCULAR | Status: DC | PRN
  Administered 2014-08-06: 5 mg via INTRAVENOUS

## 2014-08-06 SURGICAL SUPPLY — 67 items
BAG ZIPLOCK 12X15 (MISCELLANEOUS) IMPLANT
BANDAGE ELASTIC 4 VELCRO ST LF (GAUZE/BANDAGES/DRESSINGS) ×2 IMPLANT
BANDAGE ELASTIC 6 VELCRO ST LF (GAUZE/BANDAGES/DRESSINGS) ×2 IMPLANT
BANDAGE ESMARK 6X9 LF (GAUZE/BANDAGES/DRESSINGS) ×1 IMPLANT
BLADE SAG 18X100X1.27 (BLADE) ×2 IMPLANT
BLADE SAW SGTL 11.0X1.19X90.0M (BLADE) ×2 IMPLANT
BNDG ESMARK 6X9 LF (GAUZE/BANDAGES/DRESSINGS) ×2
BONE CEMENT GENTAMICIN (Cement) ×4 IMPLANT
CAP KNEE TOTAL 3 SIGMA ×2 IMPLANT
CEMENT BONE GENTAMICIN 40 (Cement) ×2 IMPLANT
CUFF TOURN SGL QUICK 34 (TOURNIQUET CUFF) ×1
CUFF TRNQT CYL 34X4X40X1 (TOURNIQUET CUFF) ×1 IMPLANT
DERMABOND ADVANCED (GAUZE/BANDAGES/DRESSINGS) ×1
DERMABOND ADVANCED .7 DNX12 (GAUZE/BANDAGES/DRESSINGS) ×1 IMPLANT
DRAPE EXTREMITY T 121X128X90 (DRAPE) ×2 IMPLANT
DRAPE INCISE IOBAN 66X45 STRL (DRAPES) IMPLANT
DRAPE POUCH INSTRU U-SHP 10X18 (DRAPES) ×2 IMPLANT
DRAPE U-SHAPE 47X51 STRL (DRAPES) ×2 IMPLANT
DRSG AQUACEL AG ADV 3.5X10 (GAUZE/BANDAGES/DRESSINGS) ×2 IMPLANT
DRSG PAD ABDOMINAL 8X10 ST (GAUZE/BANDAGES/DRESSINGS) IMPLANT
DRSG TEGADERM 4X4.75 (GAUZE/BANDAGES/DRESSINGS) ×2 IMPLANT
DURAPREP 26ML APPLICATOR (WOUND CARE) ×2 IMPLANT
ELECT REM PT RETURN 9FT ADLT (ELECTROSURGICAL) ×2
ELECTRODE REM PT RTRN 9FT ADLT (ELECTROSURGICAL) ×1 IMPLANT
EVACUATOR 1/8 PVC DRAIN (DRAIN) ×2 IMPLANT
FACESHIELD WRAPAROUND (MASK) ×10 IMPLANT
GAUZE SPONGE 2X2 8PLY STRL LF (GAUZE/BANDAGES/DRESSINGS) ×1 IMPLANT
GLOVE BIOGEL PI IND STRL 6.5 (GLOVE) ×1 IMPLANT
GLOVE BIOGEL PI IND STRL 8 (GLOVE) ×1 IMPLANT
GLOVE BIOGEL PI INDICATOR 6.5 (GLOVE) ×1
GLOVE BIOGEL PI INDICATOR 8 (GLOVE) ×1
GLOVE ECLIPSE 8.0 STRL XLNG CF (GLOVE) ×4 IMPLANT
GLOVE SURG SS PI 6.5 STRL IVOR (GLOVE) ×2 IMPLANT
GOWN STRL REUS W/TWL LRG LVL3 (GOWN DISPOSABLE) ×2 IMPLANT
GOWN STRL REUS W/TWL XL LVL3 (GOWN DISPOSABLE) ×2 IMPLANT
HANDPIECE INTERPULSE COAX TIP (DISPOSABLE) ×1
IMMOBILIZER KNEE 20 (SOFTGOODS) ×4 IMPLANT
IMMOBILIZER KNEE 20 THIGH 36 (SOFTGOODS) ×1 IMPLANT
KIT BASIN OR (CUSTOM PROCEDURE TRAY) ×2 IMPLANT
LIQUID BAND (GAUZE/BANDAGES/DRESSINGS) ×2 IMPLANT
MANIFOLD NEPTUNE II (INSTRUMENTS) ×2 IMPLANT
NEEDLE HYPO 22GX1.5 SAFETY (NEEDLE) ×4 IMPLANT
NS IRRIG 1000ML POUR BTL (IV SOLUTION) IMPLANT
PACK TOTAL JOINT (CUSTOM PROCEDURE TRAY) ×2 IMPLANT
PADDING CAST COTTON 6X4 STRL (CAST SUPPLIES) IMPLANT
POSITIONER SURGICAL ARM (MISCELLANEOUS) ×2 IMPLANT
SET HNDPC FAN SPRY TIP SCT (DISPOSABLE) ×1 IMPLANT
SET PAD KNEE POSITIONER (MISCELLANEOUS) ×2 IMPLANT
SPONGE GAUZE 2X2 STER 10/PKG (GAUZE/BANDAGES/DRESSINGS) ×1
SPONGE LAP 18X18 X RAY DECT (DISPOSABLE) IMPLANT
SPONGE SURGIFOAM ABS GEL 100 (HEMOSTASIS) ×2 IMPLANT
STAPLER VISISTAT 35W (STAPLE) IMPLANT
SUCTION FRAZIER 12FR DISP (SUCTIONS) ×2 IMPLANT
SUT BONE WAX W31G (SUTURE) ×2 IMPLANT
SUT MNCRL AB 4-0 PS2 18 (SUTURE) ×2 IMPLANT
SUT VIC AB 1 CT1 27 (SUTURE) ×2
SUT VIC AB 1 CT1 27XBRD ANTBC (SUTURE) ×2 IMPLANT
SUT VIC AB 2-0 CT1 27 (SUTURE) ×3
SUT VIC AB 2-0 CT1 TAPERPNT 27 (SUTURE) ×3 IMPLANT
SUT VLOC 180 0 24IN GS25 (SUTURE) ×2 IMPLANT
SYR 20CC LL (SYRINGE) ×6 IMPLANT
TOWEL OR 17X26 10 PK STRL BLUE (TOWEL DISPOSABLE) ×2 IMPLANT
TOWEL OR NON WOVEN STRL DISP B (DISPOSABLE) IMPLANT
TOWER CARTRIDGE SMART MIX (DISPOSABLE) ×2 IMPLANT
TRAY FOLEY CATH 14FRSI W/METER (CATHETERS) ×2 IMPLANT
WATER STERILE IRR 1500ML POUR (IV SOLUTION) ×2 IMPLANT
WRAP KNEE MAXI GEL POST OP (GAUZE/BANDAGES/DRESSINGS) ×2 IMPLANT

## 2014-08-06 NOTE — Op Note (Signed)
NAMWinfield Rast:  [place], [place]             ACCOUNT NO.:  [id number]  MEDICAL RECORD NO.:  [id number]  LOCATION:  [facility]                         [facility]AC[facility]  PHYSICIAN:  Georges Lynchonald A. Karem Farha, M.D.DATE OF BIRTH:  08-23-1961  DATE OF PROCEDURE: DATE OF DISCHARGE:                              OPERATIVE REPORT   SURGEON:  Debbi Strandberg A. Darrelyn HillockGioffre, MD.  ASSISTANT:  Dimitri PedAmber Constable, PA.  PREOPERATIVE DIAGNOSIS:  Primary osteoarthritis of the left knee.  POSTOPERATIVE DIAGNOSIS:  Primary osteoarthritis of the left knee.  OPERATION:  Left total knee arthroplasty utilizing the DePuy system, all 3 components were cemented and gentamicin was used in the cement.  The sizes used were as follows; a size 4 narrow femoral component, left posterior cruciate sacrificing type; patella was a size 38 with 3 pegs; the tibial tray was a size 3; the insert was a size 4, 12.5 mm thickness polyethylene rotating platform.  DESCRIPTION OF PROCEDURE:  Under general anesthesia, routine orthopedic prepping and draping of the left lower extremity was carried out.  The appropriate time-out was first carried out.  I also marked the appropriate left knee in the holding area.  At this time, the leg was exsanguinated with an Esmarch.  Tourniquet was elevated to 350 mmHg. With the knee flexed, an incision was made over the anterior aspect of the left knee in the usual fashion.  The patient had 2 g of IV Ancef preop.  After the incision was made, the self-retaining retractors were inserted.  I then carried out a median parapatellar approach of the knee.  I reflected patella laterally and did medial and lateral meniscectomies and excised the anterior posterior cruciate ligaments.  I did free up the medial side of the tibia as well.  Initial drill hole was made in the intercondylar notch.  We then utilized the canal finder and then irrigated out the femoral canal and removed 13 mm thickness off the femoral distal femur.  We then  measured the femur to be a size 4, left.  We did the appropriate anterior posterior chamfering cuts for a size 4 left femur.  After that, attention was directed to the tibia.  At this point, we then measured the tibia to be a size 3.  We made our initial drill hole in the tibial plateau in the usual fashion.  At this particular time, we then utilized the intramedullary technique and removed 8 mm thickness off the affected medial side.  Following that, we then went through our alignment again.  We then inserted the lamina spreaders.  We had nice bone cuts at this time as well and made sure that we had no further contractures.  Following that, we then cut our keel cut in the usual fashion of the proximal tibial plateau.  We then cut our notch, cut our distal femur.  We inserted our trial components, had an excellent fit with the 12 mm thickness insert.  Then, before doing all that, we obviously used our spacer blocks.  We then did a resurfacing procedure on the patella in the usual fashion and measured the patella be a size 38 and 3 drill holes were made in the patella.  We went through  the trials as well with the patellar button.  After this was done, we removed all trial components, thoroughly water picked out the knee, cemented all 3 components in simultaneously.  All loose pieces of cement were removed.  I then water picked the knee out again to make sure there were no loose pieces of cement.  I inserted some thrombin- soaked Gelfoam posteriorly; and after the cement was hardened, we then inserted a Hemovac drain and irrigated again with some antibiotic solution and closed the wound layers in usual fashion.  Sterile dressings were applied; and also, at the end of procedure, we reinjected 20 mL of 0.25% plain Marcaine and then Exparel 20 mL, mixed with 20 mL of __________.          ______________________________ Georges Lynchonald A. Darrelyn HillockGioffre, M.D.     RAG/MEDQ  D:  08/06/2014  T:  08/06/2014   Job:  [id number]

## 2014-08-06 NOTE — Interval H&P Note (Signed)
History and Physical Interval Note:  08/06/2014 8:18 AM  Jaclyn Knight  has presented today for surgery, with the diagnosis of OA OF LEFT KNEE  The various methods of treatment have been discussed with the patient and family. After consideration of risks, benefits and other options for treatment, the patient has consented to  Procedure(s): LEFT TOTAL KNEE ARTHROPLASTY (Left) as a surgical intervention .  The patient's history has been reviewed, patient examined, no change in status, stable for surgery.  I have reviewed the patient's chart and labs.  Questions were answered to the patient's satisfaction.     [name]

## 2014-08-06 NOTE — Brief Op Note (Signed)
08/06/2014  10:28 AM  PATIENT:  Jaclyn Knight  53 y.o. female  PRE-OPERATIVE DIAGNOSIS:  OSTEOARTHRITIS OF LEFT KNEE  POST-OPERATIVE DIAGNOSIS:  OSTEOARTHRITIS OF LEFT KNEE  PROCEDURE:  Procedure(s): LEFT TOTAL KNEE ARTHROPLASTY (Left)  SURGEON:  Surgeon(s) and Role:    * Jacki Conesonald A Jaylene Schrom, MD - Primary  PHYSICIAN ASSISTANT: Dimitri PedAmber Constable PA  ASSISTANTS: Dimitri PedAmber Constable PA1}   ANESTHESIA:   general  EBL:  Total I/O In: 1000 [I.V.:1000] Out: 650 [Urine:500; Blood:150]  BLOOD ADMINISTERED:none  DRAINS: (one) Hemovact drain(s) in the Left Knee with  Suction Open   LOCAL MEDICATIONS USED:  MARCAINE 20 cc of  0.25%  Plain. And Exparel 20cc mixed with 20cc of Normal Saline.   SPECIMEN:  No Specimen  DISPOSITION OF SPECIMEN:  N/A  COUNTS:  YES  TOURNIQUET:  * Missing tourniquet times found for documented tourniquets in log:  [id number] *  DICTATION: .Other Dictation: Dictation Number [id number]  PLAN OF CARE: Admit to inpatient   PATIENT DISPOSITION:  Stable in OR   Delay start of Pharmacological VTE agent (>24hrs) due to surgical blood loss or risk of bleeding: yes

## 2014-08-06 NOTE — Plan of Care (Signed)
Problem: Consults Goal: Total Joint Replacement Patient Education See Patient Education Module for education specifics. Outcome: Progressing Goal: Diagnosis- Total Joint Replacement Primary Total Knee Goal: Skin Care Protocol Initiated - if Braden Score 18 or less If consults are not indicated, leave blank or document N/A Outcome: Not Applicable Date Met:  33/74/45 Goal: Nutrition Consult-if indicated Outcome: Not Applicable Date Met:  14/60/47 Goal: Diabetes Guidelines if Diabetic/Glucose > 140 If diabetic or lab glucose is > 140 mg/dl - Initiate Diabetes/Hyperglycemia Guidelines & Document Interventions  Outcome: Not Applicable Date Met:  99/87/21  Problem: Phase I Progression Outcomes Goal: CMS/Neurovascular status WDL Outcome: Progressing Goal: Pain controlled with appropriate interventions Outcome: Progressing

## 2014-08-06 NOTE — Progress Notes (Signed)
Utilization review completed.  

## 2014-08-06 NOTE — Anesthesia Postprocedure Evaluation (Signed)
  Anesthesia Post-op Note  Patient: Jaclyn Knight  Procedure(s) Performed: Procedure(s) (LRB): LEFT TOTAL KNEE ARTHROPLASTY (Left)  Patient Location: PACU  Anesthesia Type: General  Level of Consciousness: awake and alert   Airway and Oxygen Therapy: Patient Spontanous Breathing  Post-op Pain: mild  Post-op Assessment: Post-op Vital signs reviewed, Patient's Cardiovascular Status Stable, Respiratory Function Stable, Patent Airway and No signs of Nausea or vomiting  Last Vitals:  Filed Vitals:   08/06/14 1101  BP: 146/76  Pulse: 98  Temp: 36.8 C  Resp: 12    Post-op Vital Signs: stable   Complications: No apparent anesthesia complications

## 2014-08-06 NOTE — Anesthesia Preprocedure Evaluation (Addendum)
Anesthesia Evaluation  Patient identified by MRN, date of birth, ID band Patient awake    Reviewed: Allergy & Precautions, H&P , NPO status , Patient's Chart, lab work & pertinent test results  Airway Mallampati: II  TM Distance: >3 FB Neck ROM: Full    Dental no notable dental hx.    Pulmonary former smoker,  breath sounds clear to auscultation  Pulmonary exam normal       Cardiovascular negative cardio ROS  Rhythm:Regular Rate:Normal     Neuro/Psych PSYCHIATRIC DISORDERS Anxiety Depression negative neurological ROS     GI/Hepatic negative GI ROS, Neg liver ROS,   Endo/Other  negative endocrine ROS  Renal/GU negative Renal ROS  negative genitourinary   Musculoskeletal  (+) Arthritis -,   Abdominal   Peds negative pediatric ROS (+)  Hematology negative hematology ROS (+)   Anesthesia Other Findings   Reproductive/Obstetrics negative OB ROS                            Anesthesia Physical Anesthesia Plan  ASA: II  Anesthesia Plan: General   Post-op Pain Management:    Induction: Intravenous  Airway Management Planned: Oral ETT  Additional Equipment:   Intra-op Plan:   Post-operative Plan: Extubation in OR  Informed Consent: I have reviewed the patients History and Physical, chart, labs and discussed the procedure including the risks, benefits and alternatives for the proposed anesthesia with the patient or authorized representative who has indicated his/her understanding and acceptance.   Dental advisory given  Plan Discussed with: CRNA  Anesthesia Plan Comments: (Discussed general and spinal. She prefers general.)       Anesthesia Quick Evaluation

## 2014-08-06 NOTE — Transfer of Care (Signed)
Immediate Anesthesia Transfer of Care Note  Patient: Jaclyn Knight  Procedure(s) Performed: Procedure(s): LEFT TOTAL KNEE ARTHROPLASTY (Left)  Patient Location: PACU  Anesthesia Type:General  Level of Consciousness: awake, alert , oriented and patient cooperative  Airway & Oxygen Therapy: Patient Spontanous Breathing and Patient connected to face mask oxygen  Post-op Assessment: Report given to PACU RN and Post -op Vital signs reviewed and stable  Post vital signs: Reviewed and stable  Complications: No apparent anesthesia complications

## 2014-08-07 ENCOUNTER — Encounter (HOSPITAL_COMMUNITY): Payer: Self-pay | Admitting: Orthopedic Surgery

## 2014-08-07 DIAGNOSIS — D62 Acute posthemorrhagic anemia: Secondary | ICD-10-CM

## 2014-08-07 LAB — CBC
HCT: 34.1 % — ABNORMAL LOW (ref 36.0–46.0)
HEMOGLOBIN: 11.3 g/dL — AB (ref 12.0–15.0)
MCH: 29 pg (ref 26.0–34.0)
MCHC: 33.1 g/dL (ref 30.0–36.0)
MCV: 87.4 fL (ref 78.0–100.0)
PLATELETS: 228 10*3/uL (ref 150–400)
RBC: 3.9 MIL/uL (ref 3.87–5.11)
RDW: 13.5 % (ref 11.5–15.5)
WBC: 10.8 10*3/uL — AB (ref 4.0–10.5)

## 2014-08-07 LAB — BASIC METABOLIC PANEL
ANION GAP: 10 (ref 5–15)
BUN: 14 mg/dL (ref 6–23)
CO2: 27 meq/L (ref 19–32)
Calcium: 8.9 mg/dL (ref 8.4–10.5)
Chloride: 99 mEq/L (ref 96–112)
Creatinine, Ser: 0.6 mg/dL (ref 0.50–1.10)
GFR calc Af Amer: >90 mL/min (ref >90)
GFR calc non Af Amer: >90 mL/min (ref >90)
GLUCOSE: 139 mg/dL — AB (ref 70–99)
Potassium: 4.2 mEq/L (ref 3.7–5.3)
SODIUM: 136 meq/L — AB (ref 137–147)

## 2014-08-07 MED ORDER — DOCUSATE SODIUM 100 MG PO CAPS
100.0000 mg | ORAL_CAPSULE | Freq: Two times a day (BID) | ORAL | Status: DC
  Administered 2014-08-07 – 2014-08-09 (×5): 100 mg via ORAL

## 2014-08-07 MED ORDER — DSS 100 MG PO CAPS: 100.0000 mg | ORAL_CAPSULE | Freq: Two times a day (BID) | ORAL | Status: DC

## 2014-08-07 MED ORDER — METHOCARBAMOL 500 MG PO TABS: 500.0000 mg | ORAL_TABLET | Freq: Four times a day (QID) | ORAL | Status: DC | PRN

## 2014-08-07 MED ORDER — OXYCODONE-ACETAMINOPHEN 5-325 MG PO TABS: 1.0000 | ORAL_TABLET | ORAL | Status: DC | PRN

## 2014-08-07 MED ORDER — FERROUS SULFATE 325 (65 FE) MG PO TABS
325.0000 mg | ORAL_TABLET | Freq: Three times a day (TID) | ORAL | Status: DC
Start: 2014-08-07 — End: 2014-09-24

## 2014-08-07 MED ORDER — RIVAROXABAN 10 MG PO TABS: 10.0000 mg | ORAL_TABLET | Freq: Every day | ORAL | Status: DC

## 2014-08-07 NOTE — Plan of Care (Signed)
Problem: Phase I Progression Outcomes Goal: CMS/Neurovascular status WDL Outcome: Completed/Met Date Met:  08/07/14 Goal: Pain controlled with appropriate interventions Outcome: Completed/Met Date Met:  08/07/14 Goal: Dangle or out of bed evening of surgery Outcome: Completed/Met Date Met:  08/07/14 Goal: Hemodynamically stable Outcome: Completed/Met Date Met:  08/07/14

## 2014-08-07 NOTE — Plan of Care (Signed)
Problem: Phase II Progression Outcomes Goal: Ambulates Outcome: Completed/Met Date Met:  08/07/14 Goal: Tolerating diet Outcome: Completed/Met Date Met:  08/07/14

## 2014-08-07 NOTE — Progress Notes (Signed)
OT Cancellation Note  Patient Details Name: Jaclyn BalSherry L Deane MRN: [id number] DOB: 11/22/1960   Cancelled Treatment:    Reason Eval/Treat Not Completed: Other (comment).  Pt was nausea and is feeling a little better but she is groggy at this time.  Will check back. Later.    Abraham Margulies 08/07/2014, 10:24 AM  Marica OtterMaryellen Marcy Bogosian, OTR/L [phone] 08/07/2014

## 2014-08-07 NOTE — Progress Notes (Signed)
Subjective: 1 Day Post-Op Procedure(s) (LRB): LEFT TOTAL KNEE ARTHROPLASTY (Left) Patient reports pain as 3 on 0-10 scale.Doing well. Hemovac DCd.Will DC tomorrow.Hbg11.3    Objective: Vital signs in last 24 hours: Temp:  [97.5 F (36.4 C)-98.2 F (36.8 C)] 98.2 F (36.8 C) (12/10 0653) Pulse Rate:  [69-98] 69 (12/10 0653) Resp:  [10-18] 16 (12/10 0653) BP: (107-146)/(54-76) 108/55 mmHg (12/10 0653) SpO2:  [94 %-100 %] 98 % (12/10 0653)  Intake/Output from previous day: 12/09 0701 - 12/10 0700 In: 4800 [P.O.:840; I.V.:3860; IV Piggyback:100] Out: 3035 [Urine:2705; Drains:180; Blood:150] Intake/Output this shift:     Recent Labs  08/07/14 0435  HGB 11.3*    Recent Labs  08/07/14 0435  WBC 10.8*  RBC 3.90  HCT 34.1*  PLT 228    Recent Labs  08/07/14 0435  NA 136*  K 4.2  CL 99  CO2 27  BUN 14  CREATININE 0.60  GLUCOSE 139*  CALCIUM 8.9   No results for input(s): LABPT, INR in the last 72 hours.  Dorsiflexion/Plantar flexion intact No cellulitis present Compartment soft  Assessment/Plan: 1 Day Post-Op Procedure(s) (LRB): LEFT TOTAL KNEE ARTHROPLASTY (Left) Up with therapy discontinued plans for Tomorrow.  Bianney Rockwood A 08/07/2014, 7:16 AM

## 2014-08-07 NOTE — Evaluation (Signed)
Physical Therapy Evaluation Patient Details Name: Jaclyn Knight MRN: [id number] DOB: 09/19/1960 Today's Date: 08/07/2014   History of Present Illness  L TKR  Clinical Impression  Pt s/p L TKR presents with decreased L LE strength/ROM and post op pain limiting functional mobility.  Pt should progress to d.c home with spouse and follow up HHPT.    Follow Up Recommendations Home health PT    Equipment Recommendations  Rolling walker with 5" wheels    Recommendations for Other Services OT consult     Precautions / Restrictions Precautions Precautions: Knee;Fall Required Braces or Orthoses: Knee Immobilizer - Left Knee Immobilizer - Left: Discontinue once straight leg raise with < 10 degree lag Restrictions Weight Bearing Restrictions: No Other Position/Activity Restrictions: WBAT      Mobility  Bed Mobility Overal bed mobility: Needs Assistance Bed Mobility: Supine to Sit     Supine to sit: Min assist;Mod assist     General bed mobility comments: cues for sequence and use of R LE to self assist  Transfers Overall transfer level: Needs assistance Equipment used: Rolling walker (2 wheeled) Transfers: Sit to/from Stand Sit to Stand: Min assist;Mod assist         General transfer comment: cues for LE management and use of UEs to self assist  Ambulation/Gait Ambulation/Gait assistance: Min assist Ambulation Distance (Feet): 38 Feet Assistive device: Rolling walker (2 wheeled) Gait Pattern/deviations: Step-to pattern;Decreased step length - right;Decreased step length - left;Shuffle;Trunk flexed   Gait velocity interpretation: Below normal speed for age/gender General Gait Details: cues for sequence, posture and position from [employer]  Stairs            Wheelchair Mobility    Modified Rankin (Stroke Patients Only)       Balance                                             Pertinent Vitals/Pain Pain Assessment: 0-10 Pain Score: 6   Pain Location: L knee Pain Descriptors / Indicators: Aching;Sore Pain Intervention(s): Limited activity within patient's tolerance;Monitored during session;Premedicated before session;Ice applied    Home Living Family/patient expects to be discharged to:: Private residence Living Arrangements: Spouse/significant other Available Help at Discharge: Family Type of Home: House Home Access: Stairs to enter Entrance Stairs-Rails: None Secretary/administratorntrance Stairs-Number of Steps: 3 Home Layout: One level Home Equipment: None      Prior Function Level of Independence: Independent               Hand Dominance   Dominant Hand: Right    Extremity/Trunk Assessment   Upper Extremity Assessment: Overall WFL for tasks assessed           Lower Extremity Assessment: LLE deficits/detail   LLE Deficits / Details: Quads 2/5 with AAROM at knee -10 - 35  Cervical / Trunk Assessment: Normal  Communication   Communication: No difficulties  Cognition Arousal/Alertness: Awake/alert Behavior During Therapy: WFL for tasks assessed/performed Overall Cognitive Status: Within Functional Limits for tasks assessed                      General Comments      Exercises Total Joint Exercises Ankle Circles/Pumps: AROM;Both;15 reps;Supine Quad Sets: AROM;Both;10 reps;Supine Heel Slides: AAROM;Left;15 reps;Supine Straight Leg Raises: AAROM;Left;10 reps;Supine      Assessment/Plan    PT Assessment Patient needs continued PT services  PT Diagnosis Difficulty walking   PT Problem List Decreased strength;Decreased range of motion;Decreased activity tolerance;Decreased mobility;Decreased knowledge of use of DME;Pain  PT Treatment Interventions DME instruction;Gait training;Stair training;Functional mobility training;Therapeutic exercise;Therapeutic activities;Patient/family education   PT Goals (Current goals can be found in the Care Plan section) Acute Rehab PT Goals Patient Stated Goal: Back  to work for [employer] PT Goal Formulation: With patient Time For Goal Achievement: 08/14/14 Potential to Achieve Goals: Good    Frequency 7X/week   Barriers to discharge        Co-evaluation               End of Session Equipment Utilized During Treatment: Gait belt;Left knee immobilizer Activity Tolerance: Patient tolerated treatment well Patient left: in chair;with call bell/phone within reach Nurse Communication: Mobility status         Time: 0820-0853 PT Time Calculation (min) (ACUTE ONLY): 33 min   Charges:   PT Evaluation $Initial PT Evaluation Tier I: 1 Procedure PT Treatments $Gait Training: 8-22 mins $Therapeutic Exercise: 8-22 mins   PT G Codes:          Viona Hosking 08/07/2014, 12:34 PM

## 2014-08-07 NOTE — Care Management Note (Signed)
    Page 1 of 2   08/07/2014     2:02:50 PM CARE MANAGEMENT NOTE 08/07/2014  Patient:  Jaclyn Knight, Jaclyn Knight   Account Number:  [id number]  Date Initiated:  08/07/2014  Documentation initiated by:  [facility]  Subjective/Objective Assessment:   adm: LEFT TOTAL KNEE ARTHROPLASTY (Left)     Action/Plan:   discharge planning   Anticipated DC Date:  08/08/2014   Anticipated DC Plan:  [place]  CM consult      [employer] Choice  NA   Choice offered to / List presented to:     DME arranged  3-N-1  Vassie Moselle      DME agency  [place] arranged  [place]   Status of service:  Completed, signed off Medicare Important Message given?   (If response is "NO", the following Medicare IM given date fields will be blank) Date Medicare IM given:   Medicare IM given by:   Date Additional Medicare IM given:   Additional Medicare IM given by:    Discharge Disposition:  [place]  Per UR Regulation:    If discussed at Long Length of Stay Meetings, dates discussed:    Comments:  08/07/14 08:00 CM met with pt in room to discuss disposition.  Pt gives CM [employer] [facility]) contact Michel Santee [phone]:  3 messages left-no callback.  CM called Leonides Grills to get alternate contact and received Larey Seat: [phone] who referred me to Lyn Hollingshead who referred me to Enedina Finner who gave me Judeen Hammans Johnson's number who gave me an alternate number for Larey Seat [phone] and Juliann Pulse states approval has been given for [facility] to render HHPT and [facility] to supply the DME. CM faxed orders, facesheet, and PT EVAl to [facility] at [phone].   CM contacted both [place] rep, Mary and [facility] rep, Pura Spice and gave them Juliann Pulse Buratt's contact information to arrange. No other CM needs were communicated.  Mariane Masters, BSN, CM [phone].

## 2014-08-07 NOTE — Progress Notes (Signed)
Physical Therapy Treatment Patient Details Name: Jaclyn Knight MRN: [id number] DOB: 10/29/1960 Today's Date: 08/07/2014    History of Present Illness L TKR    PT Comments    POD # 1 pm session.  Assisted pt out of recliner to amb to BR.  Assisted in bathroom then amb to bed.  Limited by 9/10 knee pain and MAX c/o fatigue with fear of becoming nausea.  Assisted back to bed and applied ice/notified RN pain meds.    Follow Up Recommendations  Home health PT     Equipment Recommendations  Rolling walker with 5" wheels    Recommendations for Other Services       Precautions / Restrictions Precautions Precautions: Knee;Fall Precaution Comments: Instructed pt on KI use for amb Required Braces or Orthoses: Knee Immobilizer - Left Knee Immobilizer - Left: Discontinue once straight leg raise with < 10 degree lag Restrictions Weight Bearing Restrictions: No Other Position/Activity Restrictions: WBAT    Mobility  Bed Mobility Overal bed mobility: Needs Assistance Bed Mobility: Sit to Supine     Supine to sit: Min assist     General bed mobility comments: Min assist to support L LE up onto bed  Transfers Overall transfer level: Needs assistance Equipment used: Rolling walker (2 wheeled) Transfers: Sit to/from Stand Sit to Stand: Min guard;Min assist         General transfer comment: min/minGuard off recliner and Min Assist off toilet with 25% VC's on proper tech and hand placement with increased time due to MAX c/o fatigue and fear of nausea.  Ambulation/Gait Ambulation/Gait assistance: Min guard;Min assist Ambulation Distance (Feet): 30 Feet Assistive device: Rolling walker (2 wheeled) Gait Pattern/deviations: Step-to pattern;Decreased stance time - left;Trunk flexed Gait velocity: decreased   General Gait Details: increased time and 25% VC's on safety with turns.  Pt only tolerated amb to and from BR due to 9/10 knee pain and fear of nausea.   Stairs             Wheelchair Mobility    Modified Rankin (Stroke Patients Only)       Balance                                    Cognition Arousal/Alertness: Awake/alert Behavior During Therapy: WFL for tasks assessed/performed Overall Cognitive Status: Within Functional Limits for tasks assessed                      Exercises      General Comments        Pertinent Vitals/Pain Pain Score: 7  Pain Location: L knee Pain Descriptors / Indicators: Aching Pain Intervention(s): Limited activity within patient's tolerance;Monitored during session;Patient requesting pain meds-RN notified (removed ice)    Home Living Family/patient expects to be discharged to:: Private residence Living Arrangements: Spouse/significant other Available Help at Discharge: Family Type of Home: House Home Access: Stairs to enter Entrance Stairs-Rails: None Home Layout: One level Home Equipment: None Additional Comments: 3:1 being delivered    Prior Function Level of Independence: Independent          PT Goals (current goals can now be found in the care plan section) Acute Rehab PT Goals Patient Stated Goal: Back to work for [employer] Progress towards PT goals: Progressing toward goals    Frequency  7X/week    PT Plan      Co-evaluation  End of Session Equipment Utilized During Treatment: Gait belt;Left knee immobilizer Activity Tolerance: Patient limited by fatigue Patient left: in bed;with call bell/phone within reach     Time: 1418-1445 PT Time Calculation (min) (ACUTE ONLY): 27 min  Charges:  $Gait Training: 8-22 mins $Therapeutic Activity: 8-22 mins                    G Codes:      Jaclyn Knight  PTA [facility]  Acute  Rehab Pager      [phone]

## 2014-08-07 NOTE — Evaluation (Signed)
Occupational Therapy Evaluation Patient Details Name: Jaclyn Knight MRN: [id number] DOB: 07/17/1961 Today's Date: 08/07/2014    History of Present Illness L TKR   Clinical Impression   This 53 year old female was admitted for the above surgery.  She will benefit from skilled OT in acute to educate on bathroom transfers.  At time of evaluation, pt was limited by pain and nausea.      Follow Up Recommendations  No OT follow up    Equipment Recommendations  3 in 1 bedside comode    Recommendations for Other Services       Precautions / Restrictions Precautions Precautions: Knee;Fall Required Braces or Orthoses: Knee Immobilizer - Left Knee Immobilizer - Left: Discontinue once straight leg raise with < 10 degree lag Restrictions Other Position/Activity Restrictions: WBAT      Mobility Bed Mobility                  Transfers   Equipment used: Rolling walker (2 wheeled) Transfers: Sit to/from Stand Sit to Stand: Min guard;Min assist         General transfer comment: min A from recliner, min guard from 3:1 commode.  Cues for UE/LE placement    Balance                                            ADL Overall ADL's : Needs assistance/impaired     Grooming: Wash/dry hands;Wash/dry face;Sitting   Upper Body Bathing: Set up;Sitting   Lower Body Bathing: Minimal assistance;Sit to/from stand   Upper Body Dressing : Set up;Sitting   Lower Body Dressing: Moderate assistance;Sit to/from stand   Toilet Transfer: Min guard;Minimal assistance;Stand-pivot;BSC   Toileting- ArchitectClothing Manipulation and Hygiene: Min guard;Sit to/from stand         General ADL Comments: pt transferred to 3:1 commode but did not urinate.  Completed bathing from commode and from chair.  Pt c/o nausea after this and pain increased from none to 7.  Requested pain and nausea medication.  Demonstrated AE.  Pt will have assistance at home.     Vision                      Perception     Praxis      Pertinent Vitals/Pain Pain Score: 7  Pain Location: L knee Pain Descriptors / Indicators: Aching Pain Intervention(s): Limited activity within patient's tolerance;Monitored during session;Patient requesting pain meds-RN notified (removed ice)     Hand Dominance Right   Extremity/Trunk Assessment Upper Extremity Assessment Upper Extremity Assessment: Overall WFL for tasks assessed           Communication Communication Communication: No difficulties   Cognition Arousal/Alertness: Awake/alert Behavior During Therapy: WFL for tasks assessed/performed Overall Cognitive Status: Within Functional Limits for tasks assessed                     General Comments       Exercises       Shoulder Instructions      Home Living Family/patient expects to be discharged to:: Private residence Living Arrangements: Spouse/significant other Available Help at Discharge: Family Type of Home: House Home Access: Stairs to enter Secretary/administratorntrance Stairs-Number of Steps: 3 Entrance Stairs-Rails: None Home Layout: One level     Bathroom Shower/Tub: Producer, television/film/videoWalk-in shower   Bathroom Toilet: Standard  Home Equipment: None   Additional Comments: 3:1 being delivered      Prior Functioning/Environment Level of Independence: Independent             OT Diagnosis: Acute pain   OT Problem List: Decreased strength;Decreased activity tolerance;Decreased knowledge of use of DME or AE;Pain   OT Treatment/Interventions: Self-care/ADL training;DME and/or AE instruction;Patient/family education    OT Goals(Current goals can be found in the care plan section) Acute Rehab OT Goals Patient Stated Goal: Back to work for [employer] OT Goal Formulation: With patient Time For Goal Achievement: 08/14/14 Potential to Achieve Goals: Good ADL Goals Pt Will Transfer to Toilet: with supervision;ambulating;bedside commode Pt Will Perform Toileting - Clothing  Manipulation and hygiene: with supervision;sit to/from stand Pt Will Perform Tub/Shower Transfer: Shower transfer;ambulating;3 in 1;with supervision  OT Frequency: Min 2X/week   Barriers to D/C:            Co-evaluation              End of Session Nurse Communication: Patient requests pain meds (and nausea)  Activity Tolerance: Patient limited by pain (nausea) Patient left: in chair;with call bell/phone within reach   Time: 1254-1323 OT Time Calculation (min): 29 min Charges:  OT General Charges $OT Visit: 1 Procedure OT Evaluation $Initial OT Evaluation Tier I: 1 Procedure OT Treatments $Self Care/Home Management : 23-37 mins G-Codes:    Gilberto Streck 08/07/2014, 1:34 PM  Jaclyn Knight, OTR/L [phone] 08/07/2014

## 2014-08-07 NOTE — Discharge Instructions (Addendum)
Walk with your walker. Weight bearing as tolerated. Home Health Agency will follow you at home for your therapy  Do not remove the dressing over the incision unless the dressing appears compromised.  Shower only, no tub bath. Discontinue use of vitamins, supplements, and aspirin until completion of 3 week course of Xarelto Call if any temperatures greater than 101 or any wound complications: [phone] during the day and ask for Dr. Jeannetta EllisGioffre's nurse, Mackey Birchwoodammy Johnson.   Information on my medicine - XARELTO (Rivaroxaban)  This medication education was reviewed with me or my healthcare representative as part of my discharge preparation.  The pharmacist that spoke with me during my hospital stay was:  Jamse MeadGadhia, Quiara Killian M, [facility]  Why was Xarelto prescribed for you? Xarelto was prescribed for you to reduce the risk of blood clots forming after orthopedic surgery. The medical term for these abnormal blood clots is venous thromboembolism (VTE).  What do you need to know about xarelto ? Take your Xarelto ONCE DAILY at the same time every day. You may take it either with or without food.  If you have difficulty swallowing the tablet whole, you may crush it and mix in applesauce just prior to taking your dose.  Take Xarelto exactly as prescribed by your doctor and DO NOT stop taking Xarelto without talking to the doctor who prescribed the medication.  Stopping without other VTE prevention medication to take the place of Xarelto may increase your risk of developing a clot.  After discharge, you should have regular check-up appointments with your healthcare provider that is prescribing your Xarelto.    What do you do if you miss a dose? If you miss a dose, take it as soon as you remember on the same day then continue your regularly scheduled once daily regimen the next day. Do not take two doses of Xarelto on the same day.   Important Safety Information A possible side effect of Xarelto is bleeding.  You should call your healthcare provider right away if you experience any of the following: ? Bleeding from an injury or your nose that does not stop. ? Unusual colored urine (red or dark brown) or unusual colored stools (red or black). ? Unusual bruising for unknown reasons. ? A serious fall or if you hit your head (even if there is no bleeding).  Some medicines may interact with Xarelto and might increase your risk of bleeding while on Xarelto. To help avoid this, consult your healthcare provider or pharmacist prior to using any new prescription or non-prescription medications, including herbals, vitamins, non-steroidal anti-inflammatory drugs (NSAIDs) and supplements.  This website has more information on Xarelto: [email or web address].xarelto.[email or web address].

## 2014-08-08 LAB — BASIC METABOLIC PANEL
ANION GAP: 9 (ref 5–15)
BUN: 9 mg/dL (ref 6–23)
CHLORIDE: 102 meq/L (ref 96–112)
CO2: 27 meq/L (ref 19–32)
Calcium: 8.9 mg/dL (ref 8.4–10.5)
Creatinine, Ser: 0.63 mg/dL (ref 0.50–1.10)
GFR calc Af Amer: >90 mL/min (ref >90)
GFR calc non Af Amer: >90 mL/min (ref >90)
Glucose, Bld: 134 mg/dL — ABNORMAL HIGH (ref 70–99)
Potassium: 4.3 mEq/L (ref 3.7–5.3)
Sodium: 138 mEq/L (ref 137–147)

## 2014-08-08 LAB — CBC
HEMATOCRIT: 32.2 % — AB (ref 36.0–46.0)
Hemoglobin: 10.9 g/dL — ABNORMAL LOW (ref 12.0–15.0)
MCH: 29.4 pg (ref 26.0–34.0)
MCHC: 33.9 g/dL (ref 30.0–36.0)
MCV: 86.8 fL (ref 78.0–100.0)
Platelets: 223 10*3/uL (ref 150–400)
RBC: 3.71 MIL/uL — AB (ref 3.87–5.11)
RDW: 13.5 % (ref 11.5–15.5)
WBC: 9.5 10*3/uL (ref 4.0–10.5)

## 2014-08-08 MED ORDER — HYDROMORPHONE HCL 2 MG PO TABS: 2.0000 mg | ORAL_TABLET | ORAL | Status: DC | PRN

## 2014-08-08 MED ORDER — HYDROMORPHONE HCL 2 MG PO TABS
2.0000 mg | ORAL_TABLET | ORAL | Status: DC | PRN
  Administered 2014-08-08 – 2014-08-09 (×3): 2 mg via ORAL
  Filled 2014-08-08 (×3): qty 1

## 2014-08-08 MED ORDER — ONDANSETRON HCL 4 MG PO TABS: 4.0000 mg | ORAL_TABLET | Freq: Four times a day (QID) | ORAL | Status: DC | PRN

## 2014-08-08 NOTE — Progress Notes (Signed)
Subjective: 2 Days Post-Op Procedure(s) (LRB): LEFT TOTAL KNEE ARTHROPLASTY (Left) Patient reports pain as 3 on 0-10 scale.  Doing well except for Nausea.  Objective: Vital signs in last 24 hours: Temp:  [97.7 F (36.5 C)-98.5 F (36.9 C)] 98.3 F (36.8 C) (12/11 0406) Pulse Rate:  [69-90] 90 (12/11 0406) Resp:  [16-20] 18 (12/11 0406) BP: (115-133)/(64-73) 133/69 mmHg (12/11 0406) SpO2:  [94 %-98 %] 95 % (12/11 0406)  Intake/Output from previous day: 12/10 0701 - 12/11 0700 In: 2560 [P.O.:660; I.V.:1900] Out: 1560 [Urine:1560] Intake/Output this shift:     Recent Labs  08/07/14 0435 08/08/14 0440  HGB 11.3* 10.9*    Recent Labs  08/07/14 0435 08/08/14 0440  WBC 10.8* 9.5  RBC 3.90 3.71*  HCT 34.1* 32.2*  PLT 228 223    Recent Labs  08/07/14 0435 08/08/14 0440  NA 136* 138  K 4.2 4.3  CL 99 102  CO2 27 27  BUN 14 9  CREATININE 0.60 0.63  GLUCOSE 139* 134*  CALCIUM 8.9 8.9   No results for input(s): LABPT, INR in the last 72 hours.  Dorsiflexion/Plantar flexion intact No cellulitis present  Assessment/Plan: 2 Days Post-Op Procedure(s) (LRB): LEFT TOTAL KNEE ARTHROPLASTY (Left) Discharge home with home health  Sharlee Rufino A 08/08/2014, 7:16 AM

## 2014-08-08 NOTE — Progress Notes (Signed)
OT Cancellation Note  Patient Details Name: Jaclyn Knight MRN: [id number] DOB: 09/16/1960   Cancelled Treatment:    Reason Eval/Treat Not Completed: Other (comment).  Pt limited by nausea today.  Will have OT check on her tomorrow.  Keishawn Darsey 08/08/2014, 12:55 PM  Jaclyn OtterMaryellen Jaki Knight, Jaclyn Knight [phone] 08/08/2014

## 2014-08-08 NOTE — Progress Notes (Signed)
Physical Therapy Treatment Patient Details Name: Jaclyn BalSherry L Striplin MRN: [id number] DOB: 02/23/1961 Today's Date: 08/08/2014    History of Present Illness L TKR    PT Comments    POD # 2 pt not doing well due to MAX nausea.  Only tolerated amb a short distance and did perform stair training however very unsteady gait and poor activity tolerance.  Reported to RN.  Follow Up Recommendations  Home health PT     Equipment Recommendations  Rolling walker with 5" wheels    Recommendations for Other Services       Precautions / Restrictions Precautions Precautions: Knee;Fall Precaution Comments: Instructed pt on KI use for amb and stairs Required Braces or Orthoses: Knee Immobilizer - Left Knee Immobilizer - Left: Discontinue once straight leg raise with < 10 degree lag Restrictions Weight Bearing Restrictions: No Other Position/Activity Restrictions: WBAT    Mobility  Bed Mobility Overal bed mobility: Needs Assistance Bed Mobility: Supine to Sit;Sit to Supine     Supine to sit: Min assist Sit to supine: Min assist   General bed mobility comments: Min assist to support L LE on/off bed  Transfers Overall transfer level: Needs assistance Equipment used: Rolling walker (2 wheeled) Transfers: Sit to/from Stand Sit to Stand: Min guard         General transfer comment: increased time and 25% VC's on safety with turns  Ambulation/Gait Ambulation/Gait assistance: Min guard Ambulation Distance (Feet): 24 Feet Assistive device: Rolling walker (2 wheeled) Gait Pattern/deviations: Step-to pattern;Decreased stance time - left Gait velocity: decreased   General Gait Details: increased time and 25% VC's on safety with turns.  Pt only tolerated amb a short distance due to max c/o nausea and feeling "faint"   Stairs Stairs: Yes Stairs assistance: Min assist Stair Management: No rails;Step to pattern;Backwards;With walker Number of Stairs: 4 General stair comments: 50% VC's  on proper tech and sequencing.  Performed with care giver.  Wheelchair Mobility    Modified Rankin (Stroke Patients Only)       Balance                                    Cognition                            Exercises      General Comments        Pertinent Vitals/Pain Pain Assessment: 0-10 Pain Score: 7  Pain Location: L knee Pain Descriptors / Indicators: Constant;Aching;Sore Pain Intervention(s): Monitored during session;Repositioned    Home Living                      Prior Function            PT Goals (current goals can now be found in the care plan section) Progress towards PT goals: Progressing toward goals    Frequency  7X/week    PT Plan      Co-evaluation             End of Session Equipment Utilized During Treatment: Gait belt;Left knee immobilizer Activity Tolerance: Patient limited by fatigue (nausea) Patient left: in bed;with call bell/phone within reach     Time: 1227-1252 PT Time Calculation (min) (ACUTE ONLY): 25 min  Charges:  $Gait Training: 8-22 mins $Therapeutic Activity: 8-22 mins  G Codes:      Rica Koyanagi  PTA [facility]  Acute  Rehab Pager      [phone]

## 2014-08-08 NOTE — Discharge Summary (Signed)
Physician Discharge Summary   Patient ID: Jaclyn Knight MRN: [id number] DOB/AGE: November 30, 1960 53 y.o.  Admit date: 08/06/2014 Discharge date: 08/08/2014  Primary Diagnosis: Primary osteoarthritis, left knee   Admission Diagnoses:  Past Medical History  Diagnosis Date  . Depression   . Anxiety   . Arthritis     knees oa   Discharge Diagnoses:   Active Problems:   Total knee replacement status   Postoperative anemia due to acute blood loss  Estimated body mass index is 35.53 kg/(m^2) as calculated from the following:   Height as of this encounter: 5' 6"  (1.676 m).   Weight as of this encounter: 99.791 kg (220 lb).  Procedure:  Procedure(s) (LRB): LEFT TOTAL KNEE ARTHROPLASTY (Left)   Consults: None  HPI: Jaclyn Knight, 53 y.o. female, has a history of pain and functional disability in the left knee due to trauma and arthritis and has failed non-surgical conservative treatments for greater than 12 weeks to includeNSAID's and/or analgesics, corticosteriod injections, viscosupplementation injections, supervised PT with diminished ADL's post treatment and activity modification. Onset of symptoms was gradual, starting 2 years ago with gradually worsening course since that time. The patient noted prior procedures on the knee to include arthroscopy and menisectomy on the left knee(s). Patient currently rates pain in the left knee(s) at 8 out of 10 with activity. Patient has night pain, worsening of pain with activity and weight bearing, pain that interferes with activities of daily living, pain with passive range of motion, crepitus and joint swelling. Patient has evidence of periarticular osteophytes and joint space narrowing by imaging studies. There is no active infection.  Laboratory Data: Admission on 08/06/2014  Component Date Value Ref Range Status  . WBC 08/07/2014 10.8* 4.0 - 10.5 K/uL Final  . RBC 08/07/2014 3.90  3.87 - 5.11 MIL/uL Final  . Hemoglobin 08/07/2014  11.3* 12.0 - 15.0 g/dL Final  . HCT 08/07/2014 34.1* 36.0 - 46.0 % Final  . MCV 08/07/2014 87.4  78.0 - 100.0 fL Final  . MCH 08/07/2014 29.0  26.0 - 34.0 pg Final  . MCHC 08/07/2014 33.1  30.0 - 36.0 g/dL Final  . RDW 08/07/2014 13.5  11.5 - 15.5 % Final  . Platelets 08/07/2014 228  150 - 400 K/uL Final  . Sodium 08/07/2014 136* 137 - 147 mEq/L Final  . Potassium 08/07/2014 4.2  3.7 - 5.3 mEq/L Final  . Chloride 08/07/2014 99  96 - 112 mEq/L Final  . CO2 08/07/2014 27  19 - 32 mEq/L Final  . Glucose, Bld 08/07/2014 139* 70 - 99 mg/dL Final  . BUN 08/07/2014 14  6 - 23 mg/dL Final  . Creatinine, Ser 08/07/2014 0.60  0.50 - 1.10 mg/dL Final  . Calcium 08/07/2014 8.9  8.4 - 10.5 mg/dL Final  . GFR calc non Af Amer 08/07/2014 >90  >90 mL/min Final  . GFR calc Af Amer 08/07/2014 >90  >90 mL/min Final   Comment: (NOTE) The eGFR has been calculated using the CKD EPI equation. This calculation has not been validated in all clinical situations. eGFR's persistently <90 mL/min signify possible Chronic Kidney Disease.   . Anion gap 08/07/2014 10  5 - 15 Final  . WBC 08/08/2014 9.5  4.0 - 10.5 K/uL Final   WHITE COUNT CONFIRMED ON SMEAR  . RBC 08/08/2014 3.71* 3.87 - 5.11 MIL/uL Final  . Hemoglobin 08/08/2014 10.9* 12.0 - 15.0 g/dL Final  . HCT 08/08/2014 32.2* 36.0 - 46.0 % Final  . MCV 08/08/2014  86.8  78.0 - 100.0 fL Final  . MCH 08/08/2014 29.4  26.0 - 34.0 pg Final  . MCHC 08/08/2014 33.9  30.0 - 36.0 g/dL Final  . RDW 08/08/2014 13.5  11.5 - 15.5 % Final  . Platelets 08/08/2014 223  150 - 400 K/uL Final  . Sodium 08/08/2014 138  137 - 147 mEq/L Final  . Potassium 08/08/2014 4.3  3.7 - 5.3 mEq/L Final  . Chloride 08/08/2014 102  96 - 112 mEq/L Final  . CO2 08/08/2014 27  19 - 32 mEq/L Final  . Glucose, Bld 08/08/2014 134* 70 - 99 mg/dL Final  . BUN 08/08/2014 9  6 - 23 mg/dL Final  . Creatinine, Ser 08/08/2014 0.63  0.50 - 1.10 mg/dL Final  . Calcium 08/08/2014 8.9  8.4 - 10.5 mg/dL  Final  . GFR calc non Af Amer 08/08/2014 >90  >90 mL/min Final  . GFR calc Af Amer 08/08/2014 >90  >90 mL/min Final   Comment: (NOTE) The eGFR has been calculated using the CKD EPI equation. This calculation has not been validated in all clinical situations. eGFR's persistently <90 mL/min signify possible Chronic Kidney Disease.   Georgiann Hahn gap 08/08/2014 9  5 - 15 Final  Hospital Outpatient Visit on 07/31/2014  Component Date Value Ref Range Status  . aPTT 07/31/2014 33  24 - 37 seconds Final  . Sodium 07/31/2014 140  137 - 147 mEq/L Final  . Potassium 07/31/2014 4.0  3.7 - 5.3 mEq/L Final  . Chloride 07/31/2014 101  96 - 112 mEq/L Final  . CO2 07/31/2014 24  19 - 32 mEq/L Final  . Glucose, Bld 07/31/2014 120* 70 - 99 mg/dL Final  . BUN 07/31/2014 14  6 - 23 mg/dL Final  . Creatinine, Ser 07/31/2014 0.69  0.50 - 1.10 mg/dL Final  . Calcium 07/31/2014 9.8  8.4 - 10.5 mg/dL Final  . Total Protein 07/31/2014 7.8  6.0 - 8.3 g/dL Final  . Albumin 07/31/2014 4.2  3.5 - 5.2 g/dL Final  . AST 07/31/2014 17  0 - 37 U/L Final  . ALT 07/31/2014 27  0 - 35 U/L Final  . Alkaline Phosphatase 07/31/2014 88  39 - 117 U/L Final  . Total Bilirubin 07/31/2014 0.4  0.3 - 1.2 mg/dL Final  . GFR calc non Af Amer 07/31/2014 >90  >90 mL/min Final  . GFR calc Af Amer 07/31/2014 >90  >90 mL/min Final   Comment: (NOTE) The eGFR has been calculated using the CKD EPI equation. This calculation has not been validated in all clinical situations. eGFR's persistently <90 mL/min signify possible Chronic Kidney Disease.   . Anion gap 07/31/2014 15  5 - 15 Final  . Prothrombin Time 07/31/2014 13.1  11.6 - 15.2 seconds Final  . INR 07/31/2014 0.98  0.00 - 1.49 Final  . ABO/RH(D) 07/31/2014 O POS   Final  . Antibody Screen 07/31/2014 NEG   Final  . Sample Expiration 07/31/2014 08/09/2014   Final  . Color, Urine 07/31/2014 YELLOW  YELLOW Final  . APPearance 07/31/2014 CLEAR  CLEAR Final  . Specific Gravity, Urine  07/31/2014 1.005  1.005 - 1.030 Final  . pH 07/31/2014 6.0  5.0 - 8.0 Final  . Glucose, UA 07/31/2014 NEGATIVE  NEGATIVE mg/dL Final  . Hgb urine dipstick 07/31/2014 NEGATIVE  NEGATIVE Final  . Bilirubin Urine 07/31/2014 NEGATIVE  NEGATIVE Final  . Ketones, ur 07/31/2014 NEGATIVE  NEGATIVE mg/dL Final  . Protein, ur 07/31/2014 NEGATIVE  NEGATIVE mg/dL  Final  . Urobilinogen, UA 07/31/2014 0.2  0.0 - 1.0 mg/dL Final  . Nitrite 07/31/2014 NEGATIVE  NEGATIVE Final  . Leukocytes, UA 07/31/2014 NEGATIVE  NEGATIVE Final   MICROSCOPIC NOT DONE ON URINES WITH NEGATIVE PROTEIN, BLOOD, LEUKOCYTES, NITRITE, OR GLUCOSE <1000 mg/dL.  . WBC 07/31/2014 7.3  4.0 - 10.5 K/uL Final  . RBC 07/31/2014 4.62  3.87 - 5.11 MIL/uL Final  . Hemoglobin 07/31/2014 13.4  12.0 - 15.0 g/dL Final  . HCT 07/31/2014 39.0  36.0 - 46.0 % Final  . MCV 07/31/2014 84.4  78.0 - 100.0 fL Final  . MCH 07/31/2014 29.0  26.0 - 34.0 pg Final  . MCHC 07/31/2014 34.4  30.0 - 36.0 g/dL Final  . RDW 07/31/2014 13.1  11.5 - 15.5 % Final  . Platelets 07/31/2014 289  150 - 400 K/uL Final  . MRSA, PCR 07/31/2014 NEGATIVE  NEGATIVE Final  . Staphylococcus aureus 07/31/2014 POSITIVE* NEGATIVE Final   Comment:        The Xpert SA Assay (FDA approved for NASAL specimens in patients over 72 years of age), is one component of a comprehensive surveillance program.  Test performance has been validated by [employer] for patients greater than or equal to 69 year old. It is not intended to diagnose infection nor to guide or monitor treatment.   . Preg, Serum 07/31/2014 NEGATIVE  NEGATIVE Final   Comment:        THE SENSITIVITY OF THIS METHODOLOGY IS >10 mIU/mL.   . ABO/RH(D) 07/31/2014 O POS   Final    EKG: Orders placed or performed during the hospital encounter of 07/31/14  . EKG  . EKG  . EKG 12-Lead  . EKG 12-Lead     Hospital Course: Jaclyn Knight is a 53 y.o. who was admitted to [facility]. They were brought  to the operating room on 08/06/2014 and underwent Procedure(s): LEFT TOTAL KNEE ARTHROPLASTY.  Patient tolerated the procedure well and was later transferred to the recovery room and then to the orthopaedic floor for postoperative care.  They were given PO and IV analgesics for pain control following their surgery.  They were given 24 hours of postoperative antibiotics of  Anti-infectives    Start     Dose/Rate Route Frequency Ordered Stop   08/06/14 1500  ceFAZolin (ANCEF) IVPB 1 g/50 mL premix     1 g100 mL/hr over 30 Minutes Intravenous Every 6 hours 08/06/14 1220 08/06/14 2216   08/06/14 0928  polymyxin B 500,000 Units, bacitracin 50,000 Units in sodium chloride irrigation 0.9 % 500 mL irrigation  Status:  Discontinued       As needed 08/06/14 0928 08/06/14 1057   08/06/14 0632  ceFAZolin (ANCEF) IVPB 2 g/50 mL premix     2 g100 mL/hr over 30 Minutes Intravenous On call to O.R. 08/06/14 2458 08/06/14 0835     and started on DVT prophylaxis in the form of Xarelto.   PT and OT were ordered for total joint protocol.  Discharge planning consulted to help with postop disposition and equipment needs.  Patient had a fair night on the evening of surgery.  They started to get up OOB with therapy on day one. Hemovac drain was pulled without difficulty.  Continued to work with therapy into day two. Patient had some issues with nausea and was therefore sent home with an anti-emetic. Patient was seen in rounds and was Knight to go home.   Diet: Regular diet Activity:WBAT Follow-up:in 2  weeks Disposition - Home Discharged Condition: stable   Discharge Instructions    Call MD / Call 911    Complete by:  As directed   If you experience chest pain or shortness of breath, CALL 911 and be transported to the hospital emergency room.  If you develope a fever above 101 F, pus (white drainage) or increased drainage or redness at the wound, or calf pain, call your surgeon's office.     Constipation Prevention     Complete by:  As directed   Drink plenty of fluids.  Prune juice may be helpful.  You may use a stool softener, such as Colace (over the counter) 100 mg twice a day.  Use MiraLax (over the counter) for constipation as needed.     Diet general    Complete by:  As directed      Discharge instructions    Complete by:  As directed   Walk with your walker. Weight bearing as tolerated. [place] will follow you at home for your therapy  Do not remove the dressing over the incision unless the dressing appears compromised.  Shower only, no tub bath. Discontinue use of vitamins, supplements, and aspirin until completion of 3 week course of Xarelto Call if any temperatures greater than 101 or any wound complications: [phone] during the day and ask for Dr. Charlestine Night nurse, Brunilda Payor.     Do not put a pillow under the knee. Place it under the heel.    Complete by:  As directed      Driving restrictions    Complete by:  As directed   No driving while on pain medications     Increase activity slowly as tolerated    Complete by:  As directed             Medication List    STOP taking these medications        naproxen 500 MG tablet  Commonly known as:  NAPROSYN     traMADol 50 MG tablet  Commonly known as:  ULTRAM      TAKE these medications        buPROPion 150 MG 24 hr tablet  Commonly known as:  WELLBUTRIN XL  Take 150 mg by mouth every morning. Can take 2 more doses prn in day if needed     DSS 100 MG Caps  Take 100 mg by mouth 2 (two) times daily.     ferrous sulfate 325 (65 FE) MG tablet  Take 1 tablet (325 mg total) by mouth 3 (three) times daily after meals.     FLUoxetine 40 MG capsule  Commonly known as:  PROZAC  Take 40 mg by mouth every morning.     IOPHEN C-NR 100-10 MG/5ML syrup  Generic drug:  guaiFENesin-codeine     methocarbamol 500 MG tablet  Commonly known as:  ROBAXIN  Take 1 tablet (500 mg total) by mouth every 6 (six) hours as needed for  muscle spasms.     ondansetron 4 MG tablet  Commonly known as:  ZOFRAN  Take 1 tablet (4 mg total) by mouth every 6 (six) hours as needed for nausea.     oxyCODONE-acetaminophen 5-325 MG per tablet  Commonly known as:  PERCOCET/ROXICET  Take 1-2 tablets by mouth every 4 (four) hours as needed for moderate pain.     pantoprazole 40 MG tablet  Commonly known as:  PROTONIX  Take 1 tablet (40 mg total) by mouth daily. Take  30-60 min before first meal of the day     predniSONE 10 MG tablet  Commonly known as:  STERAPRED UNI-PAK  Prednisone 10 mg take  4 each am x 2 days,   2 each am x 2 days,  1 each am x2days and stop     rivaroxaban 10 MG Tabs tablet  Commonly known as:  XARELTO  Take 1 tablet (10 mg total) by mouth daily with breakfast.     venlafaxine XR 75 MG 24 hr capsule  Commonly known as:  EFFEXOR-XR  Take 75 mg by mouth every morning.           Follow-up Information    Follow up with GIOFFRE,RONALD A, MD. Schedule an appointment as soon as possible for a visit in 2 weeks.   Specialty:  Orthopedic Surgery   Contact information:   [address] [place] [address] [phone]       Follow up with [facility].   Why:  home health physical therapy   Contact information:   [address] [address] [phone]       Follow up with [facility].   Why:  rolling walker and 3n1 (commode)   Contact information:   [place] [address] [phone]       Signed: Ardeen Jourdain, PA-C Orthopaedic Surgery 08/08/2014, 7:12 AM

## 2014-08-09 LAB — CBC
HEMATOCRIT: 30.5 % — AB (ref 36.0–46.0)
HEMOGLOBIN: 10.4 g/dL — AB (ref 12.0–15.0)
MCH: 29.4 pg (ref 26.0–34.0)
MCHC: 34.1 g/dL (ref 30.0–36.0)
MCV: 86.2 fL (ref 78.0–100.0)
Platelets: 210 10*3/uL (ref 150–400)
RBC: 3.54 MIL/uL — ABNORMAL LOW (ref 3.87–5.11)
RDW: 13.3 % (ref 11.5–15.5)
WBC: 9.4 10*3/uL (ref 4.0–10.5)

## 2014-08-09 NOTE — Progress Notes (Signed)
Occupational Therapy Treatment Patient Details Name: Marcie BalSherry L Frankl MRN: [id number] DOB: 09/15/1960 Today's Date: 08/09/2014    History of present illness L TKR   OT comments  Patient practiced toilet and tub transfers this session. All education completed and patient reports she will discharge home today.  Follow Up Recommendations  No OT follow up    Equipment Recommendations  3 in 1 bedside comode    Recommendations for Other Services      Precautions / Restrictions Precautions Precautions: Knee Precaution Comments: Instructed pt on KI use for amb and stairs Required Braces or Orthoses: Knee Immobilizer - Left Knee Immobilizer - Left: Discontinue once straight leg raise with < 10 degree lag Restrictions Weight Bearing Restrictions: No Other Position/Activity Restrictions: WBAT       Mobility Bed Mobility Overal bed mobility: Needs Assistance Bed Mobility: Supine to Sit;Sit to Supine     Supine to sit: Min assist Sit to supine: Min assist   General bed mobility comments: Assist for L LE  Transfers Overall transfer level: Needs assistance Equipment used: Rolling walker (2 wheeled) Transfers: Sit to/from Stand Sit to Stand: Min guard         General transfer comment: close guard for safety.     Balance                                   ADL Overall ADL's : Needs assistance/impaired                         Toilet Transfer: Min guard;Ambulation;BSC   Toileting- ArchitectClothing Manipulation and Hygiene: Supervision/safety;Sit to/from stand   Tub/ Shower Transfer: Walk-in shower;Min guard;Ambulation;Rolling walker   Functional mobility during ADLs: Min guard;Rolling walker General ADL Comments: Patient doing well with toilet and shower transfers and will have assistance at home as needed. Nausea better today.      Vision                     Perception     Praxis      Cognition   Behavior During Therapy: WFL for  tasks assessed/performed Overall Cognitive Status: Within Functional Limits for tasks assessed                       Extremity/Trunk Assessment               Exercises    Shoulder Instructions       General Comments      Pertinent Vitals/ Pain       Pain Assessment: 0-10 Pain Score: 6  Pain Location: L knee Pain Descriptors / Indicators: Aching;Sore Pain Intervention(s): Patient requesting pain meds-RN notified  Home Living                                          Prior Functioning/Environment              Frequency Min 2X/week     Progress Toward Goals  OT Goals(current goals can now be found in the care plan section)  Progress towards OT goals: Progressing toward goals     Plan Discharge plan remains appropriate    Co-evaluation                 End  of Session Equipment Utilized During Treatment: Rolling walker;Left knee immobilizer   Activity Tolerance Patient tolerated treatment well   Patient Left in bed;with call bell/phone within reach;with family/visitor present   Nurse Communication Patient requests pain meds        Time: [id number] OT Time Calculation (min): 12 min  Charges: OT General Charges $OT Visit: 1 Procedure OT Treatments $Self Care/Home Management : 8-22 mins  Jaeshaun Riva A 08/09/2014, 10:37 AM

## 2014-08-09 NOTE — Progress Notes (Signed)
   Subjective: 3 Days Post-Op Procedure(s) (LRB): LEFT TOTAL KNEE ARTHROPLASTY (Left)  Pt doing well Feeling much better today Ready for d/c today Patient reports pain as mild.  Objective:   VITALS:   Filed Vitals:   08/09/14 0548  BP: 127/70  Pulse: 86  Temp: 98.9 F (37.2 C)  Resp: 16    Left knee incision healing well nv intact distally aquacel in place  LABS  Recent Labs  08/07/14 0435 08/08/14 0440 08/09/14 0550  HGB 11.3* 10.9* 10.4*  HCT 34.1* 32.2* 30.5*  WBC 10.8* 9.5 9.4  PLT 228 223 210     Recent Labs  08/07/14 0435 08/08/14 0440  NA 136* 138  K 4.2 4.3  BUN 14 9  CREATININE 0.60 0.63  GLUCOSE 139* 134*     Assessment/Plan: 3 Days Post-Op Procedure(s) (LRB): LEFT TOTAL KNEE ARTHROPLASTY (Left)  D/c home today F/u in 2 weeks    Alphonsa OverallBrad Constance Hackenberg, MPAS, PA-C  08/09/2014, 7:29 AM

## 2014-08-09 NOTE — Progress Notes (Signed)
Discharged from floor via w/c, significant other with pt. No changes in assessment. Keyandra Swenson, [employer] Bath [employer] Beyond[employer]

## 2014-08-09 NOTE — Plan of Care (Signed)
Problem: Phase III Progression Outcomes Goal: Anticoagulant follow-up in place Outcome: Not Applicable Date Met:  82/51/89 xarelto  Problem: Discharge Progression Outcomes Goal: Anticoagulant follow-up in place Outcome: Not Applicable Date Met:  84/21/03 xarelto

## 2014-08-09 NOTE — Progress Notes (Signed)
Physical Therapy Treatment Patient Details Name: Jaclyn Knight MRN: [id number] DOB: 09/29/1960 Today's Date: 08/09/2014    History of Present Illness L TKR    PT Comments    Some improvement from last session. Pt tolerated ambulation distance fairly well-pt denied lightheadedness/dizziness but did report "feeling warm". Practiced exercises as well. Pt denied need to practice steps a 2nd time on today-stated she has decided to use entrance with 1 step instead. All education completed.   Follow Up Recommendations  Home health PT;Supervision - Intermittent     Equipment Recommendations  Rolling walker with 5" wheels    Recommendations for Other Services OT consult     Precautions / Restrictions Precautions Precautions: Knee Precaution Comments: Instructed pt on KI use for amb and stairs Required Braces or Orthoses: Knee Immobilizer - Left Knee Immobilizer - Left: Discontinue once straight leg raise with < 10 degree lag Restrictions Weight Bearing Restrictions: No Other Position/Activity Restrictions: WBAT    Mobility  Bed Mobility Overal bed mobility: Needs Assistance Bed Mobility: Supine to Sit;Sit to Supine     Supine to sit: Min assist Sit to supine: Min assist   General bed mobility comments: Assist for L LE  Transfers Overall transfer level: Needs assistance Equipment used: Rolling walker (2 wheeled) Transfers: Sit to/from Stand Sit to Stand: Min guard         General transfer comment: close guard for safety.   Ambulation/Gait Ambulation/Gait assistance: Min guard Ambulation Distance (Feet): 65 Feet Assistive device: Rolling walker (2 wheeled) Gait Pattern/deviations: Step-to pattern;Decreased stride length;Antalgic     General Gait Details: close guard for safety. Pt reported "feeling warm" while ambulating. Took brief standing rest break then pt was able to continue.    Stairs            Wheelchair Mobility    Modified Rankin (Stroke  Patients Only)       Balance                                    Cognition Arousal/Alertness: Awake/alert Behavior During Therapy: WFL for tasks assessed/performed Overall Cognitive Status: Within Functional Limits for tasks assessed                      Exercises Total Joint Exercises Ankle Circles/Pumps: AROM;Both;15 reps;Supine Quad Sets: AROM;Both;10 reps;Supine Heel Slides: AAROM;Left;10 reps;Supine Hip ABduction/ADduction: AAROM;Left;10 reps;Supine Straight Leg Raises: AAROM;Left;10 reps;Supine Goniometric ROM: 10-45 degrees (limted by pain-pt was not medicated prior)    General Comments        Pertinent Vitals/Pain Pain Assessment: 0-10 Pain Score: 7  Pain Location: L knee after exercises Pain Descriptors / Indicators: Aching;Sore Pain Intervention(s): Monitored during session;Ice applied;Patient requesting pain meds-RN notified    Home Living                      Prior Function            PT Goals (current goals can now be found in the care plan section) Progress towards PT goals: Progressing toward goals    Frequency  7X/week    PT Plan Current plan remains appropriate    Co-evaluation             End of Session Equipment Utilized During Treatment: Gait belt;Left knee immobilizer Activity Tolerance: Patient tolerated treatment well Patient left: in bed;with call bell/phone within reach;with family/visitor present  Time: [id number] PT Time Calculation (min) (ACUTE ONLY): 25 min  Charges:  $Gait Training: 8-22 mins $Therapeutic Exercise: 8-22 mins                    G Codes:      Rebeca AlertJannie Elajah Kunsman, MPT Pager: [phone]

## 2014-09-24 ENCOUNTER — Encounter: Payer: Self-pay | Admitting: Internal Medicine

## 2014-09-24 ENCOUNTER — Ambulatory Visit (INDEPENDENT_AMBULATORY_CARE_PROVIDER_SITE_OTHER)
Admission: RE | Admit: 2014-09-24 | Discharge: 2014-09-24 | Disposition: A | Discharge status: Home / Self Care | Payer: BLUE CROSS/BLUE SHIELD | Source: Ambulatory Visit | Attending: Internal Medicine | Admitting: Internal Medicine

## 2014-09-24 ENCOUNTER — Ambulatory Visit (INDEPENDENT_AMBULATORY_CARE_PROVIDER_SITE_OTHER): Payer: BLUE CROSS/BLUE SHIELD | Admitting: Internal Medicine

## 2014-09-24 DIAGNOSIS — R05 Cough: Secondary | ICD-10-CM

## 2014-09-24 DIAGNOSIS — R059 Cough, unspecified: Secondary | ICD-10-CM

## 2014-09-24 IMAGING — CT CT CHEST W/O CM
2 of 3 series · 15 of 36 positions shown, 18 images · IV contrast (Omnipaque 300)
Comparison: 09/26/2012

CLINICAL DATA: Followup pulmonary nodules

EXAM:
CT CHEST WITHOUT CONTRAST
TECHNIQUE: Multidetector CT imaging of the chest was performed following the
standard protocol without IV contrast.

[Series 2: chest routine with · axial · 0.71mm/px · z∈[-300,-40]mm · 12 of 62 slices shown, 15 images]
[im 5/62  mediastinal]
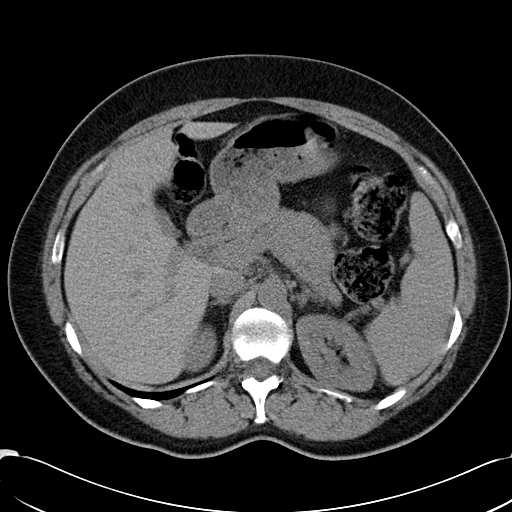
[im 5/62  lung]
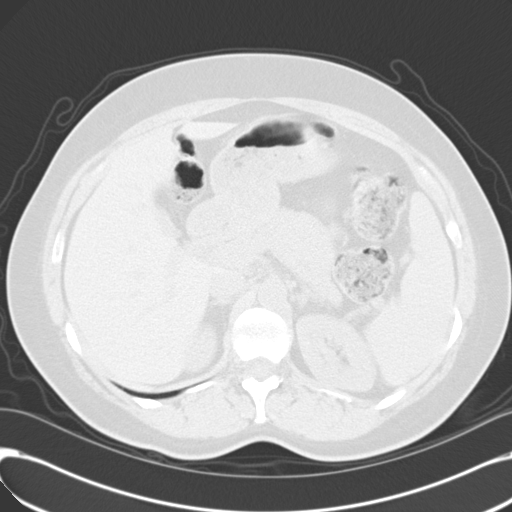
[im 10/62  lung]
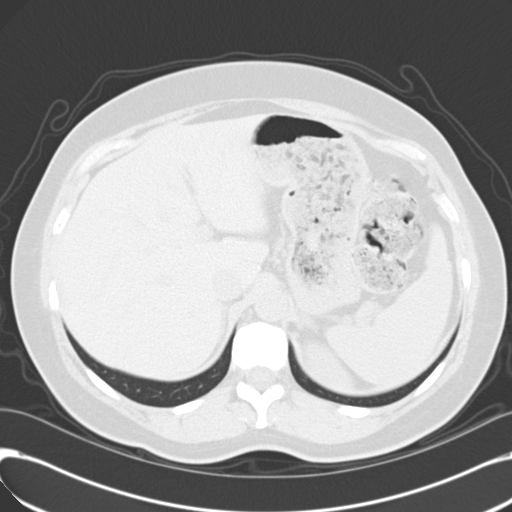
[im 14/62  lung]
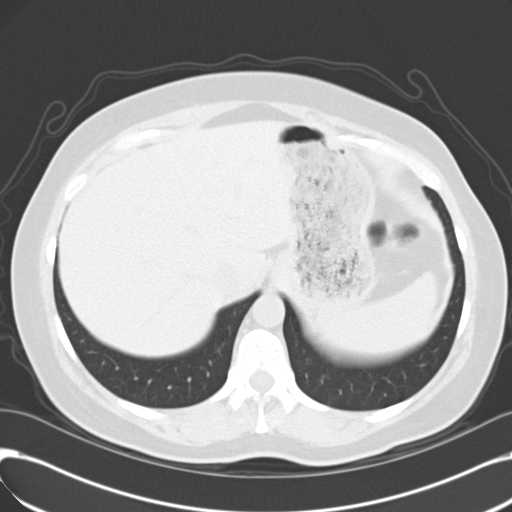
[im 19/62  lung]
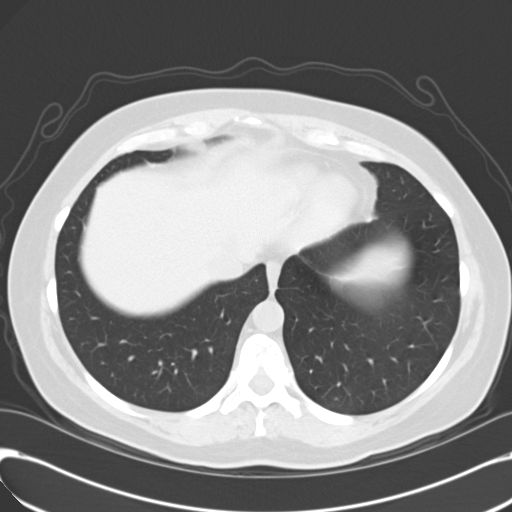
[im 23/62  mediastinal]
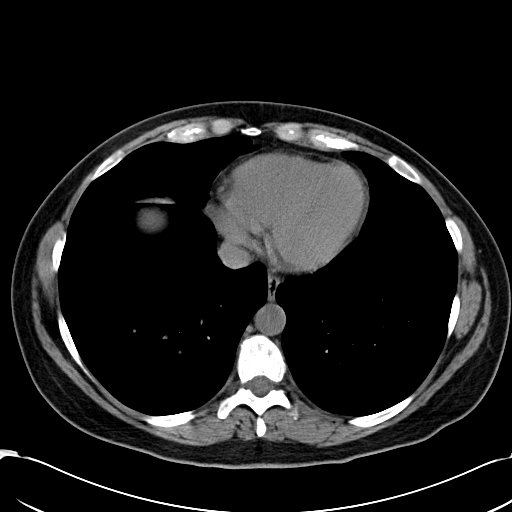
[im 23/62  lung]
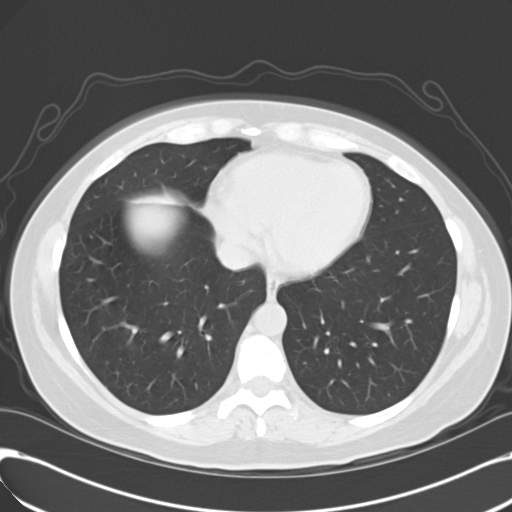
[im 28/62  lung]
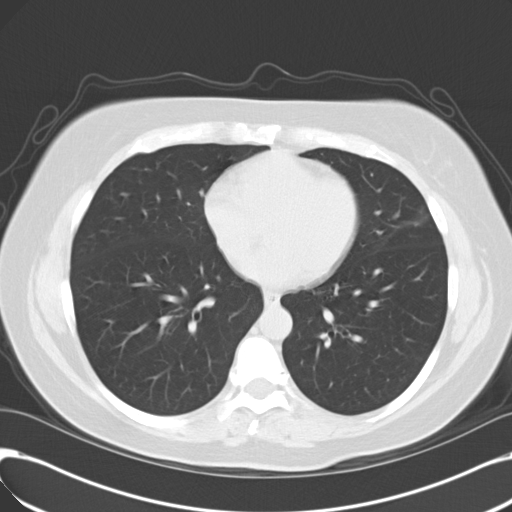
[im 34/62  lung]
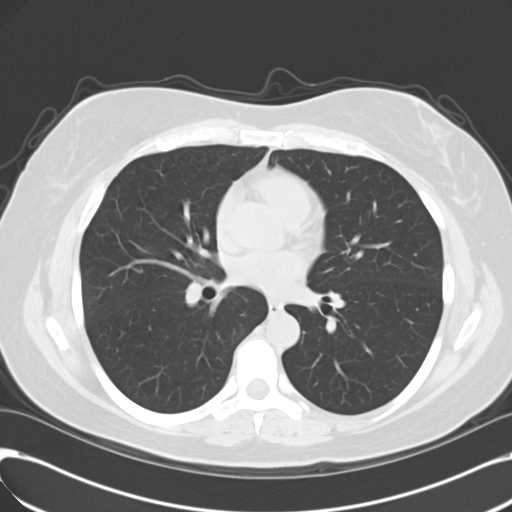
[im 39/62  lung]
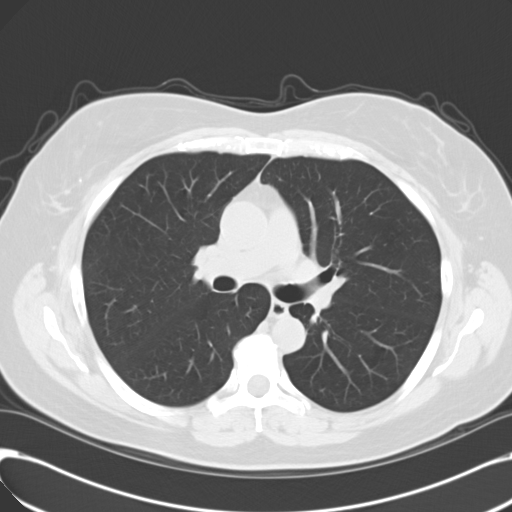
[im 43/62  mediastinal]
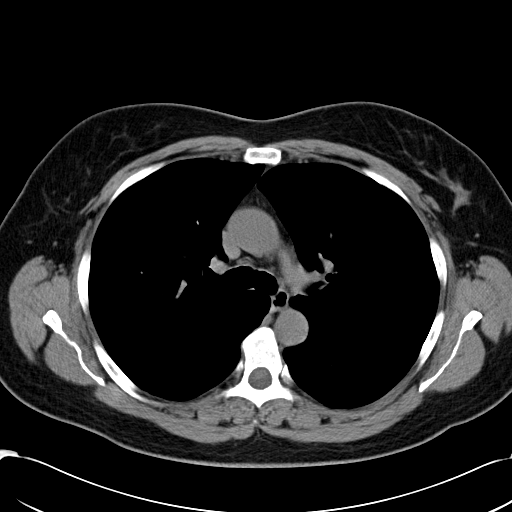
[im 43/62  lung]
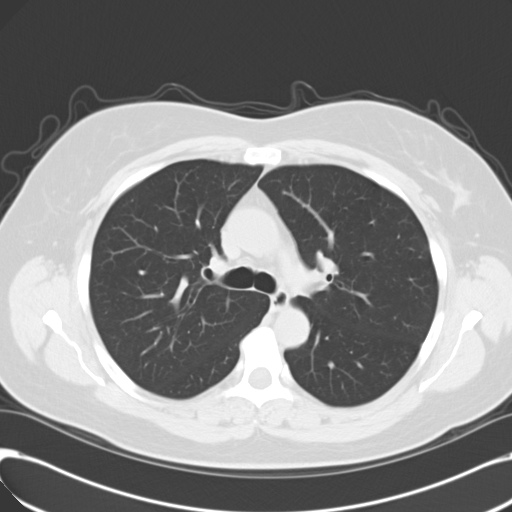
[im 48/62  lung]
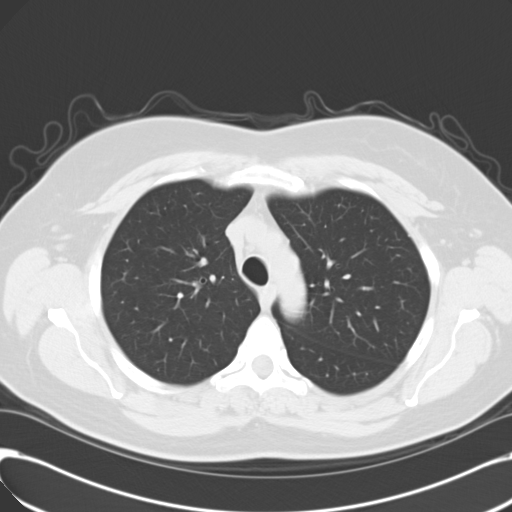
[im 52/62  lung]
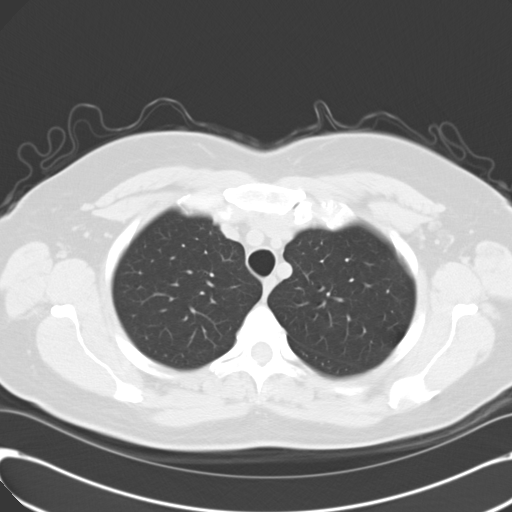
[im 57/62  lung]
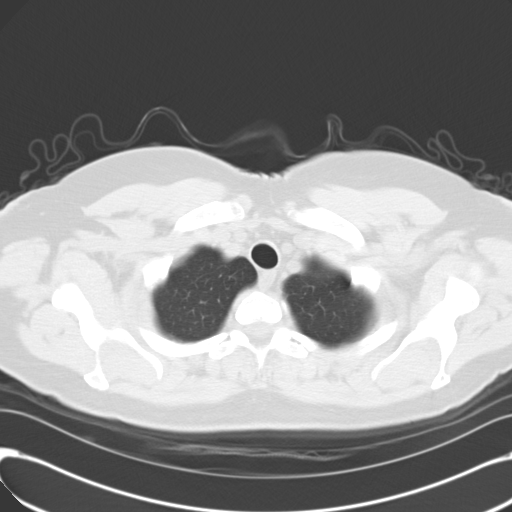

[Series 602: cor · coronal · 0.71mm/px · 3 of 108 slices shown]
[im 22/108  lung]
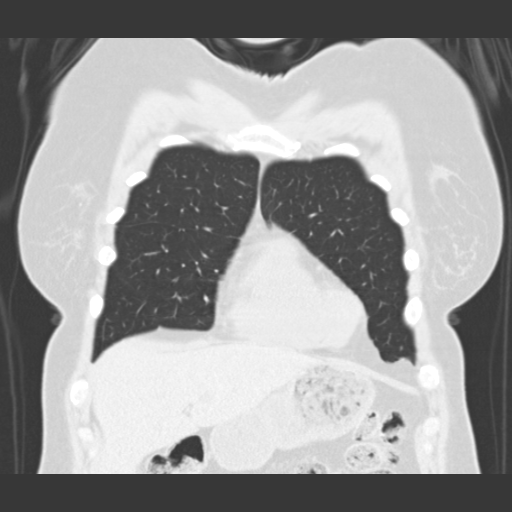
[im 43/108  lung]
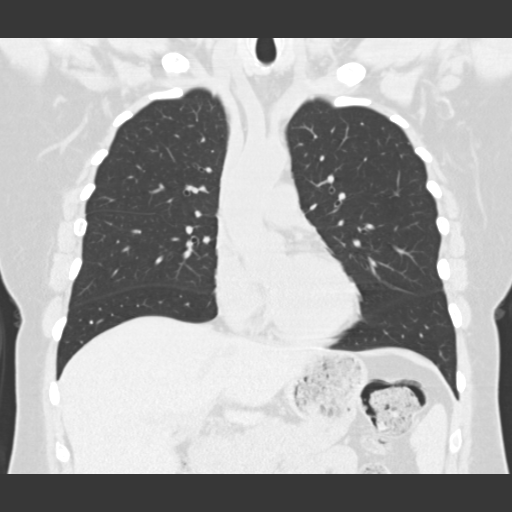
[im 65/108  lung]
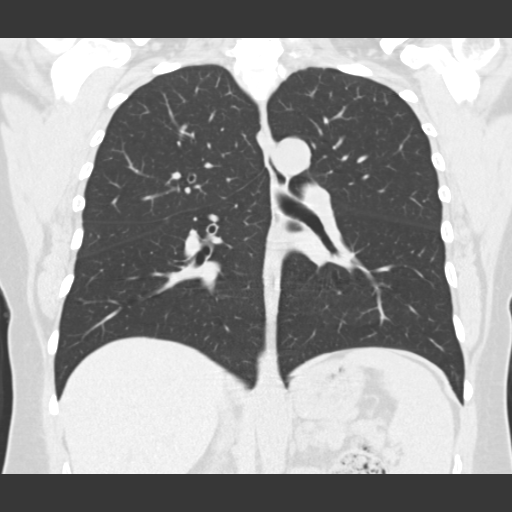

[15 of 36 positions shown; findings below may reference images not displayed]

FINDINGS: No pleural effusion identified. There is no airspace consolidation
or atelectasis noted. Stable 4 mm nodule in the right middle lobe,
image 32/series 3.

The trachea appears patent and is midline. The heart size appears
normal. There is no pericardial effusion identified. No enlarged
mediastinal or hilar lymph nodes identified. There is no enlarged
axillary or supraclavicular adenopathy.

Incidental imaging through the upper abdomen shows no acute
findings.

Review of the visualized bony structures is on unremarkable. No
aggressive lytic or sclerotic bone lesions.
IMPRESSION: 1. No acute findings.
2. Stable right middle lobe nodule measuring 4 mm. This is most
likely a benign abnormality. No further followup is necessary.

## 2014-09-24 MED ORDER — TRAMADOL HCL 50 MG PO TABS: ORAL_TABLET | ORAL | Status: DC

## 2014-09-24 MED ORDER — PANTOPRAZOLE SODIUM 40 MG PO TBEC: 40.0000 mg | DELAYED_RELEASE_TABLET | Freq: Every day | ORAL | Status: DC

## 2014-09-24 MED ORDER — PREDNISONE 10 MG PO TABS: ORAL_TABLET | ORAL | Status: DC

## 2014-09-24 MED ORDER — FAMOTIDINE 20 MG PO TABS: ORAL_TABLET | ORAL | Status: DC

## 2014-09-24 NOTE — Progress Notes (Signed)
Subjective:    Patient ID: Jaclyn Knight, female    DOB: 10/16/1960   MRN: [id number]    Brief patient profile:  53 yowf quit smoking around 2000 and no resp problems then started pattern of recurrent yearly "bronchitis" starting w/in a few years of smoking cessation self referred 10/03/2012 to pulmonary clinic for refractory cough c/w UACS    History of Present Illness  10/03/2012 1st pulmonary eval cc acute onset around xmas 2013 rx mid jan with flonase, zpak but worse cough and sob so then rx prednisone then vicodin added for pain L lat chest with cough, never fever or nasty mucus,  Never vomited, Pos cough micturition despite pelvic sling.  Started flovent 2/4 > worse cough rec Cyclical cough regimen > completely resolved     09/24/2014 acute ov/Wert re: severe recurrent cough  Chief Complaint  Patient presents with  . Follow-up    Pt c/o cough for the past 6 wks- non prod and worse when she talks.  She sometimes coughs until the point of vomiting and loses urinary continence.   coughing and chest pressure while not on GERD rx,  indolent onset/ progressively worse rx zpak and tessalon not better  Cough was productive / green now dry and mostly daytime with voice use    No obvious day to day or daytime variabilty or assoc sob unless coughing  or cp or chest tightness, subjective wheeze overt sinus or hb symptoms. No unusual exp hx or h/o childhood pna/ asthma or knowledge of premature birth.  Sleeping ok without nocturnal  or early am exacerbation  of respiratory  c/o's or need for noct saba. Also denies any obvious fluctuation of symptoms with weather or environmental changes or other aggravating or alleviating factors except as outlined above   Current Medications, Allergies, Complete Past Medical History, Past Surgical History, Family History, and Social History were reviewed in [facility] electronic medical record.  ROS  The following are not active complaints unless  bolded sore throat, dysphagia, dental problems, itching, sneezing,  nasal congestion or excess/ purulent secretions, ear ache,   fever, chills, sweats, unintended wt loss, pleuritic or exertional cp, hemoptysis,  orthopnea pnd or leg swelling, presyncope, palpitations, heartburn, abdominal pain, anorexia, nausea, vomiting, diarrhea  or change in bowel or urinary habits, change in stools or urine, dysuria,hematuria,  rash, arthralgias, visual complaints, headache, numbness weakness or ataxia or problems with walking or coordination,  change in mood/affect or memory.                      Objective:   Physical Exam  Amb wf nad  09/24/2014        216  Wt Readings from Last 3 Encounters:  10/03/12 209 lb 3.2 oz (94.892 kg)  09/26/12 201 lb (91.173 kg)  03/06/12 208 lb (94.348 kg)    HEENT: nl dentition, turbinates, and orophanx. Nl external ear canals without cough reflex   NECK :  without JVD/Nodes/TM/ nl carotid upstrokes bilaterally   LUNGS: no acc muscle use, clear to A and P bilaterally without cough on insp or exp maneuvers   CV:  RRR  no s3 or murmur or increase in P2, no edema   ABD:  soft and nontender with nl excursion in the supine position. No bruits or organomegaly, bowel sounds nl  MS:  warm without deformities, calf tenderness, cyanosis or clubbing  SKIN: warm and dry without lesions    NEURO:  alert, approp, no  deficits      CXR PA and Lateral:   09/24/2014 :     I personally reviewed images and agree with radiology impression as follows:    No acute cardiopulmonary disease.          Assessment & Plan:

## 2014-09-24 NOTE — Patient Instructions (Signed)
The key to effective treatment for your cough is eliminating the non-stop cycle of cough you're stuck in long enough to let your airway heal completely and then see if there is anything still making you cough once you stop the cough suppression, but this should take no more than 5 days to figure out  First take delsym two tsp every 12 hours and supplement if needed with  tramadol 50 mg up to 2 every 4 hours to suppress the urge to cough at all or even clear your throat. Swallowing water or using ice chips/non mint and menthol containing candies (such as lifesavers or sugarless jolly ranchers) are also effective.  You should rest your voice and avoid activities that you know make you cough.  Once you have eliminated the cough for 3 straight days try reducing the tramadol first,  then the delsym as tolerated.    For drainage take chlortrimeton (chlorpheniramine) 4 mg every 4 hours available over the counter (may cause drowsiness)   Prednisone 10 mg take  4 each am x 2 days,   2 each am x 2 days,  1 each am x 2 days and stop (this is to eliminate allergies and inflammation from coughing)  Protonix (pantoprazole) Take 30-60 min before first meal of the day and Pepcid 20 mg one bedtime plus chlorpheniramine 4 mg x 2 at bedtime (both available over the counter)  until cough is completely gone for at least a week without the need for cough suppression  GERD (REFLUX)  is an extremely common cause of respiratory symptoms, many times with no significant heartburn at all.    It can be treated with medication, but also with lifestyle changes including avoidance of late meals, excessive alcohol, smoking cessation, and avoid fatty foods, chocolate, peppermint, colas, red wine, and acidic juices such as orange juice.  NO MINT OR MENTHOL PRODUCTS SO NO COUGH DROPS  USE HARD CANDY INSTEAD (jolley ranchers or Stover's or Lifesavers (all available in sugarless versions) NO OIL BASED VITAMINS - use powdered  substitutes.  Please remember to go to the  x-ray department downstairs for your tests - we will call you with the results when they are available.     Return if not better by next week

## 2014-09-24 NOTE — Progress Notes (Signed)
Quick Note:  LMTCB ______ 

## 2014-09-25 ENCOUNTER — Telehealth: Payer: Self-pay

## 2014-09-25 NOTE — Telephone Encounter (Signed)
Notes Recorded by Nyoka CowdenMichael B Wert, MD on 09/24/2014 at 4:09 PM Call pt: Reviewed cxr and no acute change [facility]  Results have been explained to patient, pt expressed understanding. Nothing further needed.

## 2014-09-28 ENCOUNTER — Encounter: Payer: Self-pay | Admitting: Internal Medicine

## 2014-09-28 NOTE — Assessment & Plan Note (Addendum)
Previously responded to treatment for cyclical cough/ upper airway cough syndrome now recurrent/  Severe cough while off maint rx for gerd   Classic Upper airway cough syndrome, so named because it's frequently impossible to sort out how much is  CR/sinusitis with freq throat clearing (which can be related to primary GERD)   vs  causing  secondary (" extra esophageal")  GERD from wide swings in gastric pressure that occur with throat clearing, often  promoting self use of mint and menthol lozenges that reduce the lower esophageal sphincter tone and exacerbate the problem further in a cyclical fashion.   These are the same pts (now being labeled as having "irritable larynx syndrome" by some cough centers) who not infrequently have a history of having failed to tolerate ace inhibitors,  dry powder inhalers or biphosphonates or report having atypical reflux symptoms that don't respond to standard doses of PPI , and are easily confused as having aecopd or asthma flares by even experienced allergists/ pulmonologists.   For now resume heavy cough suppression with gerd rx - since did fine off gerd rx chronically this should be the last med she stops when cough is gone and there first med she restarts in the event of any exac or resp symptoms of any cause (viral/ allergic/ environmental etc)  See instructions for specific recommendations which were reviewed directly with the patient who was given a copy with highlighter outlining the key components.

## 2014-10-07 ENCOUNTER — Telehealth: Payer: Self-pay | Admitting: Internal Medicine

## 2014-10-07 NOTE — Telephone Encounter (Signed)
Called and spoke to pt. Appt made with MW on 10/14/14. Pt verbalized understanding and denied any further questions or concerns at this time. Pt aware to call back if anything worsens.

## 2014-10-14 ENCOUNTER — Ambulatory Visit: Payer: Self-pay | Admitting: Internal Medicine

## 2014-10-20 ENCOUNTER — Encounter (HOSPITAL_COMMUNITY): Payer: Self-pay | Admitting: *Deleted

## 2014-10-27 ENCOUNTER — Other Ambulatory Visit: Payer: Self-pay | Admitting: Gastroenterology

## 2014-10-27 NOTE — Addendum Note (Signed)
Addended by: Charlott RakesSCHOOLER, Leotta Weingarten on: 10/27/2014 02:31 PM   Modules accepted: Orders

## 2014-10-28 ENCOUNTER — Encounter (HOSPITAL_COMMUNITY)
Admission: RE | Disposition: A | Discharge status: Home / Self Care | Payer: Self-pay | Source: Ambulatory Visit | Attending: Gastroenterology

## 2014-10-28 ENCOUNTER — Ambulatory Visit (HOSPITAL_COMMUNITY)
Admission: RE | Admit: 2014-10-28 | Discharge: 2014-10-28 | Disposition: A | Discharge status: Home / Self Care | Payer: BLUE CROSS/BLUE SHIELD | Source: Ambulatory Visit | Attending: Gastroenterology | Admitting: Gastroenterology

## 2014-10-28 ENCOUNTER — Ambulatory Visit (HOSPITAL_COMMUNITY): Payer: BLUE CROSS/BLUE SHIELD | Admitting: Certified Registered Nurse Anesthetist

## 2014-10-28 ENCOUNTER — Encounter (HOSPITAL_COMMUNITY): Payer: Self-pay | Admitting: Certified Registered Nurse Anesthetist

## 2014-10-28 DIAGNOSIS — K219 Gastro-esophageal reflux disease without esophagitis: Secondary | ICD-10-CM | POA: Insufficient documentation

## 2014-10-28 DIAGNOSIS — Z87891 Personal history of nicotine dependence: Secondary | ICD-10-CM | POA: Diagnosis not present

## 2014-10-28 DIAGNOSIS — Z862 Personal history of diseases of the blood and blood-forming organs and certain disorders involving the immune mechanism: Secondary | ICD-10-CM | POA: Diagnosis not present

## 2014-10-28 DIAGNOSIS — K2970 Gastritis, unspecified, without bleeding: Secondary | ICD-10-CM | POA: Diagnosis not present

## 2014-10-28 DIAGNOSIS — M1990 Unspecified osteoarthritis, unspecified site: Secondary | ICD-10-CM | POA: Insufficient documentation

## 2014-10-28 DIAGNOSIS — R05 Cough: Secondary | ICD-10-CM | POA: Diagnosis present

## 2014-10-28 HISTORY — PX: ESOPHAGOGASTRODUODENOSCOPY (EGD) WITH PROPOFOL: SHX5813

## 2014-10-28 HISTORY — PX: BRAVO PH STUDY: SHX5421

## 2014-10-28 SURGERY — ESOPHAGOGASTRODUODENOSCOPY (EGD) WITH PROPOFOL
Anesthesia: Monitor Anesthesia Care

## 2014-10-28 MED ORDER — PROPOFOL INFUSION 10 MG/ML OPTIME
INTRAVENOUS | Status: DC | PRN
  Administered 2014-10-28: 200 ug/kg/min via INTRAVENOUS

## 2014-10-28 MED ORDER — LACTATED RINGERS IV SOLN
INTRAVENOUS | Status: DC | PRN
  Administered 2014-10-28: 12:00:00 via INTRAVENOUS

## 2014-10-28 MED ORDER — PROPOFOL 10 MG/ML IV BOLUS
INTRAVENOUS | Status: AC
  Filled 2014-10-28: qty 20

## 2014-10-28 MED ORDER — LIDOCAINE HCL (CARDIAC) 20 MG/ML IV SOLN
INTRAVENOUS | Status: AC
  Filled 2014-10-28: qty 5

## 2014-10-28 MED ORDER — LIDOCAINE HCL (CARDIAC) 20 MG/ML IV SOLN
INTRAVENOUS | Status: DC | PRN
  Administered 2014-10-28: 80 mg via INTRAVENOUS

## 2014-10-28 MED ORDER — ONDANSETRON HCL 4 MG/2ML IJ SOLN
INTRAMUSCULAR | Status: AC
  Filled 2014-10-28: qty 2

## 2014-10-28 MED ORDER — ONDANSETRON HCL 4 MG/2ML IJ SOLN
INTRAMUSCULAR | Status: DC | PRN
  Administered 2014-10-28: 4 mg via INTRAVENOUS

## 2014-10-28 MED ORDER — SODIUM CHLORIDE 0.9 % IV SOLN: INTRAVENOUS | Status: DC

## 2014-10-28 MED ORDER — PROPOFOL 10 MG/ML IV BOLUS
INTRAVENOUS | Status: DC | PRN
  Administered 2014-10-28: 40 mg via INTRAVENOUS
  Administered 2014-10-28 (×4): 20 mg via INTRAVENOUS

## 2014-10-28 SURGICAL SUPPLY — 14 items

## 2014-10-28 NOTE — Interval H&P Note (Signed)
History and Physical Interval Note:  10/28/2014 12:09 PM  Jaclyn Knight  has presented today for surgery, with the diagnosis of acid reflux  The various methods of treatment have been discussed with the patient and family. After consideration of risks, benefits and other options for treatment, the patient has consented to  Procedure(s): ESOPHAGOGASTRODUODENOSCOPY (EGD) WITH PROPOFOL (N/A) BRAVO PH STUDY (N/A) as a surgical intervention .  The patient's history has been reviewed, patient examined, no change in status, stable for surgery.  I have reviewed the patient's chart and labs.  Questions were answered to the patient's satisfaction.     Tracie Lindbloom C.

## 2014-10-28 NOTE — Anesthesia Preprocedure Evaluation (Signed)
Anesthesia Evaluation  Patient identified by MRN, date of birth, ID band Patient awake    Reviewed: Allergy & Precautions, NPO status , Patient's Chart, lab work & pertinent test results  Airway        Dental   Pulmonary former smoker,          Cardiovascular     Neuro/Psych    GI/Hepatic   Endo/Other    Renal/GU      Musculoskeletal  (+) Arthritis -,   Abdominal   Peds  Hematology  (+) anemia ,   Anesthesia Other Findings   Reproductive/Obstetrics                             Anesthesia Physical Anesthesia Plan  ASA: II  Anesthesia Plan: MAC   Post-op Pain Management:    Induction: Intravenous  Airway Management Planned: Simple Face Mask  Additional Equipment:   Intra-op Plan:   Post-operative Plan:   Informed Consent: I have reviewed the patients History and Physical, chart, labs and discussed the procedure including the risks, benefits and alternatives for the proposed anesthesia with the patient or authorized representative who has indicated his/her understanding and acceptance.     Plan Discussed with: CRNA, Anesthesiologist and Surgeon  Anesthesia Plan Comments:         Anesthesia Quick Evaluation

## 2014-10-28 NOTE — Op Note (Signed)
[facility] [address] [place], [id number]   ENDOSCOPY PROCEDURE REPORT  PATIENT: Jaclyn Knight, Jaclyn Knight  MR#: [id number] BIRTHDATE: Jan 04, 1961 , 53  yrs. old GENDER: female ENDOSCOPIST: Charlott RakesVincent Braeden Kennan, MD REFERRED BY:  Gretel AcreADAKU NNODI, M.D. PROCEDURE DATE:  10/28/2014 PROCEDURE:  EGD w/ Bravo capsule placement ASA CLASS:     Class II INDICATIONS:  GERD; Chronic cough. MEDICATIONS: TOPICAL ANESTHETIC:  DESCRIPTION OF PROCEDURE: After the risks benefits and alternatives of the procedure were thoroughly explained, informed consent was obtained.  The Pentax Gastroscope [id number] endoscope was introduced through the mouth and advanced to the second portion of the duodenum , Without limitations.  The instrument was slowly withdrawn as the mucosa was fully examined.    Esophagus normal and GEJ 38 cm from the incisors. Minimal focal erythema in the pre-pyloric channel consistent with minimal gastritis. Stomach otherwise normal. Duodenal bulb and 2nd portion of the duodenum normal in appearance. Endoscope removed and Bravo capsule catheter inserted and deployed in the usual fashion (6 cm above the GEJ at 32 cm). Endoscope reinserted and Bravo capsule deployed successfully without any immediate complications. Retroflexed views revealed no abnormalities.     The scope was then withdrawn from the patient and the procedure completed.  COMPLICATIONS: There were no immediate complications.  ENDOSCOPIC IMPRESSION:     MInimal gastritis; S/P Bravo placement  RECOMMENDATIONS:     F/U on Bravo results (done off of PPIs and H2 blockers)   eSigned:  Charlott RakesVincent Crewe Heathman, MD 10/28/2014 12:45 PM    CC:  CPT CODES: ICD CODES:  The ICD and CPT codes recommended by this software are interpretations from the data that the clinical staff has captured with the software.  The verification of the translation of this report to the ICD and CPT codes and modifiers is the  sole responsibility of the health care institution and practicing physician where this report was generated.  [employer] will not be held responsible for the validity of the ICD and CPT codes included on this report.  AMA assumes no liability for data contained or not contained herein. CPT is a Publishing rights managerregistered trademark of the [employer].  PATIENT NAME:  Jaclyn Knight, Jaclyn Knight MR#: [id number]

## 2014-10-28 NOTE — Anesthesia Postprocedure Evaluation (Signed)
  Anesthesia Post-op Note  Patient: Jaclyn Knight  Procedure(s) Performed: Procedure(s): ESOPHAGOGASTRODUODENOSCOPY (EGD) WITH PROPOFOL (N/A) BRAVO PH STUDY (N/A)  Patient Location: PACU  Anesthesia Type:MAC  Level of Consciousness: awake, alert , oriented and patient cooperative  Airway and Oxygen Therapy: Patient Spontanous Breathing  Post-op Pain: none  Post-op Assessment: Post-op Vital signs reviewed, Patient's Cardiovascular Status Stable, Respiratory Function Stable, Patent Airway, No signs of Nausea or vomiting and Pain level controlled  Post-op Vital Signs: stable  Last Vitals:  Filed Vitals:   10/28/14 1311  BP: 142/81  Pulse: 74  Temp:   Resp: 13    Complications: No apparent anesthesia complications

## 2014-10-28 NOTE — H&P (Signed)
  Date of Initial H&P: 10/13/14  History reviewed, patient examined, no change in status, stable for surgery.

## 2014-10-28 NOTE — Transfer of Care (Signed)
Immediate Anesthesia Transfer of Care Note  Patient: Jaclyn Knight  Procedure(s) Performed: Procedure(s) (LRB): ESOPHAGOGASTRODUODENOSCOPY (EGD) WITH PROPOFOL (N/A) BRAVO PH STUDY (N/A)  Patient Location: Endoscopy  Anesthesia Type: MAC  Level of Consciousness: sedated, patient cooperative and responds to stimulation  Airway & Oxygen Therapy: Patient Spontanous Breathing and Patient connected to face mask oxgen  Post-op Assessment: Report given to Endo RN and Post -op Vital signs reviewed and stable  Post vital signs: Reviewed and stable  Complications: No apparent anesthesia complications

## 2014-10-29 ENCOUNTER — Encounter (HOSPITAL_COMMUNITY): Payer: Self-pay | Admitting: Gastroenterology

## 2015-01-06 ENCOUNTER — Telehealth: Payer: Self-pay | Admitting: Genetic Counselor

## 2015-01-06 NOTE — Telephone Encounter (Signed)
Called pt and left message to schedule new pt gen counseling appt.    Dx: BRCA 1 & 2 testing for family history of breast cancer Referring: Laurin Coder, NP

## 2015-01-08 ENCOUNTER — Telehealth: Payer: Self-pay | Admitting: Genetic Counselor

## 2015-01-08 NOTE — Telephone Encounter (Signed)
Attempted to leave second message regarding gen counseling appt. Mailbox was full.

## 2015-01-19 ENCOUNTER — Telehealth: Payer: Self-pay | Admitting: Genetic Counselor

## 2015-01-19 NOTE — Telephone Encounter (Signed)
Called pt and scheduled gen counseling appt for 02/02/15 @ 11am.

## 2015-02-02 ENCOUNTER — Ambulatory Visit (HOSPITAL_BASED_OUTPATIENT_CLINIC_OR_DEPARTMENT_OTHER): Payer: BLUE CROSS/BLUE SHIELD | Admitting: Genetic Counselor

## 2015-02-02 ENCOUNTER — Other Ambulatory Visit: Payer: BLUE CROSS/BLUE SHIELD

## 2015-02-02 ENCOUNTER — Encounter: Payer: Self-pay | Admitting: Genetic Counselor

## 2015-02-02 DIAGNOSIS — Z860100 Personal history of colon polyps, unspecified: Secondary | ICD-10-CM

## 2015-02-02 DIAGNOSIS — Z315 Encounter for genetic counseling: Secondary | ICD-10-CM

## 2015-02-02 DIAGNOSIS — Z808 Family history of malignant neoplasm of other organs or systems: Secondary | ICD-10-CM

## 2015-02-02 DIAGNOSIS — Z8052 Family history of malignant neoplasm of bladder: Secondary | ICD-10-CM

## 2015-02-02 DIAGNOSIS — K635 Polyp of colon: Secondary | ICD-10-CM

## 2015-02-02 DIAGNOSIS — Z8051 Family history of malignant neoplasm of kidney: Secondary | ICD-10-CM

## 2015-02-02 DIAGNOSIS — Z803 Family history of malignant neoplasm of breast: Secondary | ICD-10-CM | POA: Insufficient documentation
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
## 2015-02-17 ENCOUNTER — Telehealth: Payer: Self-pay | Admitting: Genetic Counselor

## 2015-02-17 ENCOUNTER — Encounter: Payer: Self-pay | Admitting: Genetic Counselor

## 2015-02-17 DIAGNOSIS — Z1379 Encounter for other screening for genetic and chromosomal anomalies: Secondary | ICD-10-CM | POA: Insufficient documentation

## 2015-02-17 DIAGNOSIS — K635 Polyp of colon: Secondary | ICD-10-CM

## 2015-02-17 DIAGNOSIS — Z803 Family history of malignant neoplasm of breast: Secondary | ICD-10-CM

## 2015-02-17 DIAGNOSIS — Z8051 Family history of malignant neoplasm of kidney: Secondary | ICD-10-CM

## 2015-02-17 NOTE — Progress Notes (Signed)
HPI: Jaclyn Knight was previously seen in the [place] clinic due to a family history of cancer and concerns regarding a hereditary predisposition to cancer. Please refer to our prior cancer genetics clinic note for more information regarding Jaclyn Knight's medical, social and family histories, and our assessment and recommendations, at the time. Jaclyn Knight recent genetic test results were disclosed to her, as were recommendations warranted by these results. These results and recommendations are discussed in more detail below.  GENETIC TEST RESULTS: At the time of Jaclyn Knight's visit, we recommended she pursue genetic testing of the [facility] gene panel. The Custom gene panel offered by [facility] includes sequencing and rearrangement analysis for the following 32 genes: ATM, BAP1, BARD1, BRCA1, BRCA2, BRIP1, CDH1, CHEK2, EPCAM, FANCC, FH, FLCN, MET, MITF, MLH1, MSH2, MSH6, NBN, PALB2, PMS2, PTEN, RAD51C, RAD51D, SDHB, SDHC, SDHD, STK11, TP53, TSC1, TSC2, VHL and XRCC2.   The report date is February 13, 2015.  Genetic testing was normal, and did not reveal a deleterious mutation in these genes. The test report has been scanned into EPIC and is located under the Media tab.   We discussed with Jaclyn Knight that since the current genetic testing is not perfect, it is possible there may be a gene mutation in one of these genes that current testing cannot detect, but that chance is small. We also discussed, that it is possible that another gene that has not yet been discovered, or that we have not yet tested, is responsible for the cancer diagnoses in the family, and it is, therefore, important to remain in touch with cancer genetics in the future so that we can continue to offer Jaclyn Knight the most up to date genetic testing.   Genetic testing did detect a Variant of Unknown Significance in the [facility] gene called  [id number].[id number]. At this time, it is unknown if this variant is associated with increased  cancer risk or if this is a normal finding, but most variants such as this get reclassified to being inconsequential. It should not be used to make medical management decisions. With time, we suspect the lab will determine the significance of this variant, if any. If we do learn more about it, we will try to contact Jaclyn Knight to discuss it further. However, it is important to stay in touch with [place] periodically and keep the address and phone number up to date.   CANCER SCREENING RECOMMENDATIONS: This normal result is reassuring and indicates that Jaclyn Knight does not likely have an increased risk of cancer due to a mutation in one of these genes.  We, therefore, recommended  Jaclyn Knight continue to follow the cancer screening guidelines provided by her primary healthcare providers.   RECOMMENDATIONS FOR FAMILY MEMBERS: Individuals in this family might be at some increased risk of developing cancer, over the general population risk, simply due to the family history of cancer. We recommended women in this family have a yearly mammogram beginning at age 54, or 42 years younger than the earliest onset of cancer, an an annual clinical breast exam, and perform monthly breast self-exams. Women in this family should also have a gynecological exam as recommended by their primary provider. All family members should have a colonoscopy by age 5.  Based on Jaclyn Knight's family history, we recommended her sister, who was diagnosed with breast cancer at ages 51 and 21, or her paternal cousin who was diagnosed with breast cancer, have genetic counseling and testing. Jaclyn Knight will let  us know if we can be of any assistance in coordinating genetic counseling and/or testing for this family member.   FOLLOW-UP: Lastly, we discussed with Jaclyn Knight that cancer genetics is a rapidly advancing field and it is possible that new genetic tests will be appropriate for her and/or her family members in the future. We  encouraged her to remain in contact with cancer genetics on an annual basis so we can update her personal and family histories and let her know of advances in cancer genetics that may benefit this family.   Our contact number was provided. Jaclyn Knight questions were answered to her satisfaction, and she knows she is welcome to call us at anytime with additional questions or concerns.   Roma Kayser, MS, [facility] Certified Genetic Counselor [email or web address]

## 2015-02-17 NOTE — Telephone Encounter (Signed)
LM on VM with good news on genetic test results.  Asked that she call back. 

## 2015-09-16 IMAGING — CR DG CHEST 2V
2 series · 2 of 2 positions shown · non-contrast
Comparison: 09/17/2012.

CLINICAL DATA: Chronic cough.  Smoker.

EXAM:
CHEST  2 VIEW

[view not recorded (1 of 2)]
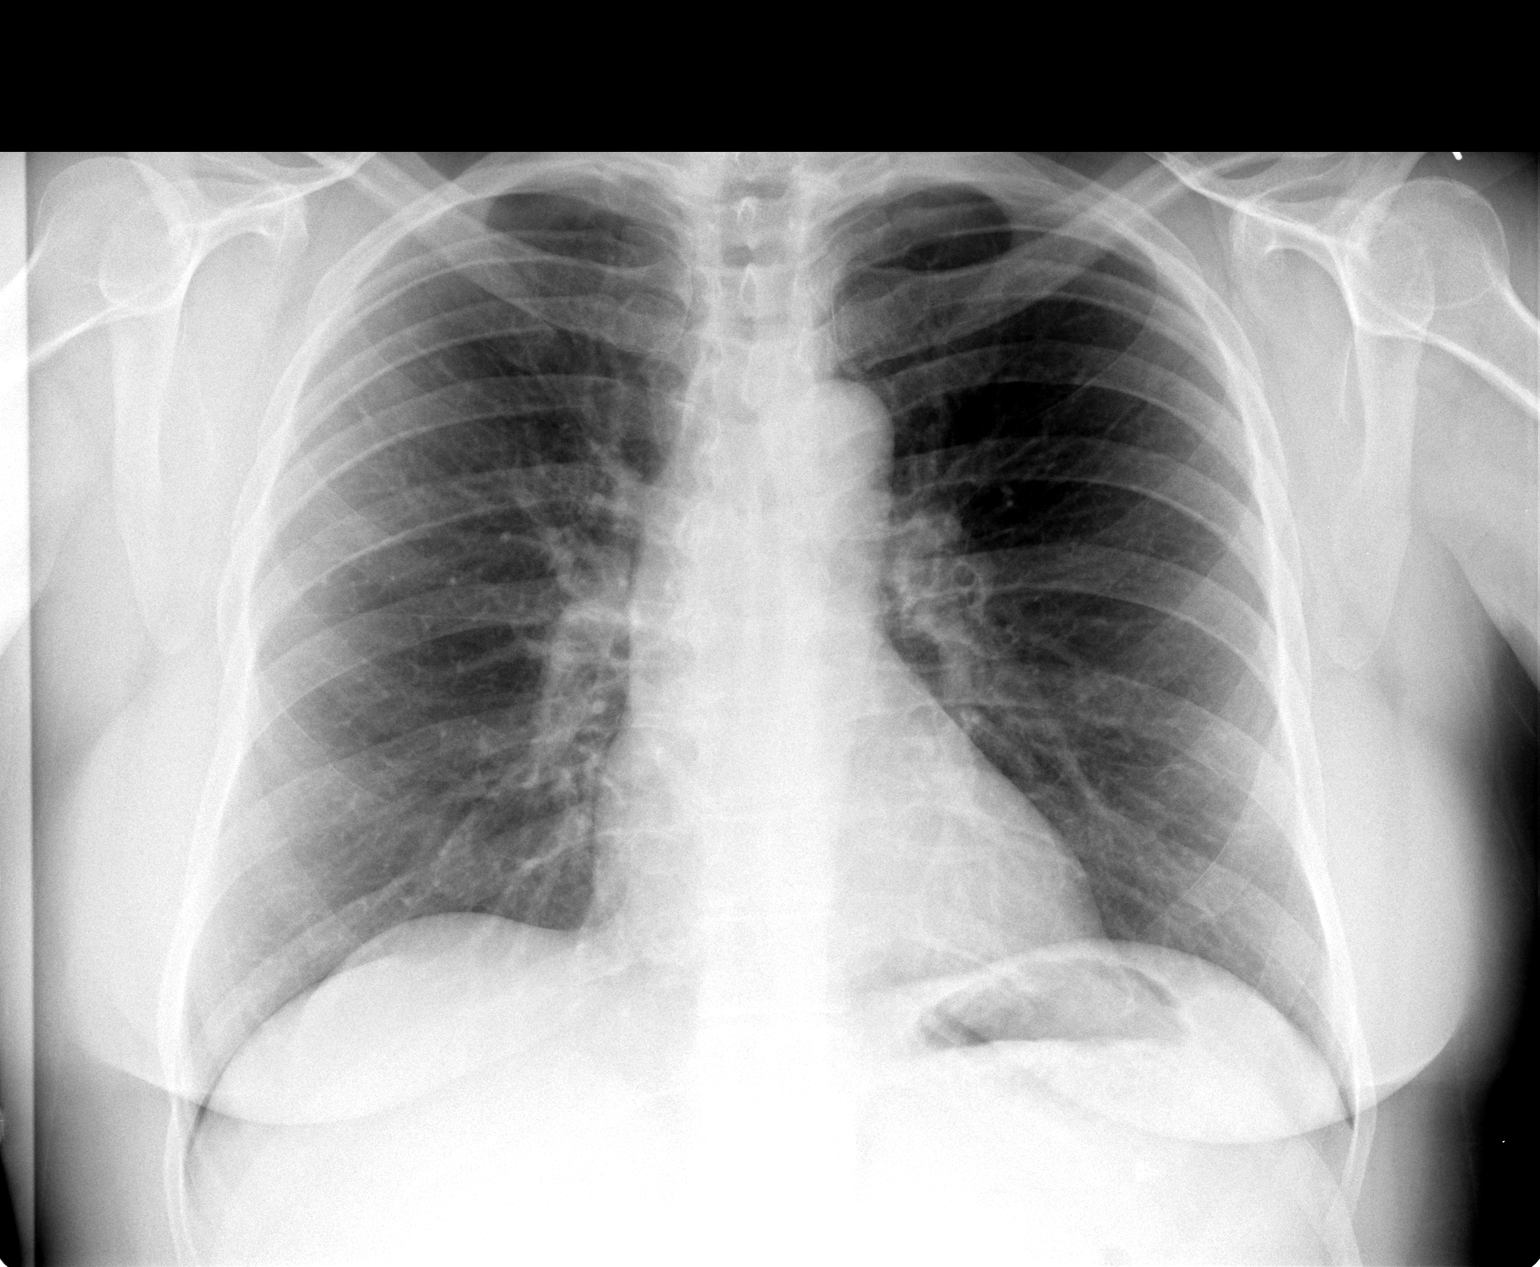

[view not recorded (2 of 2)]
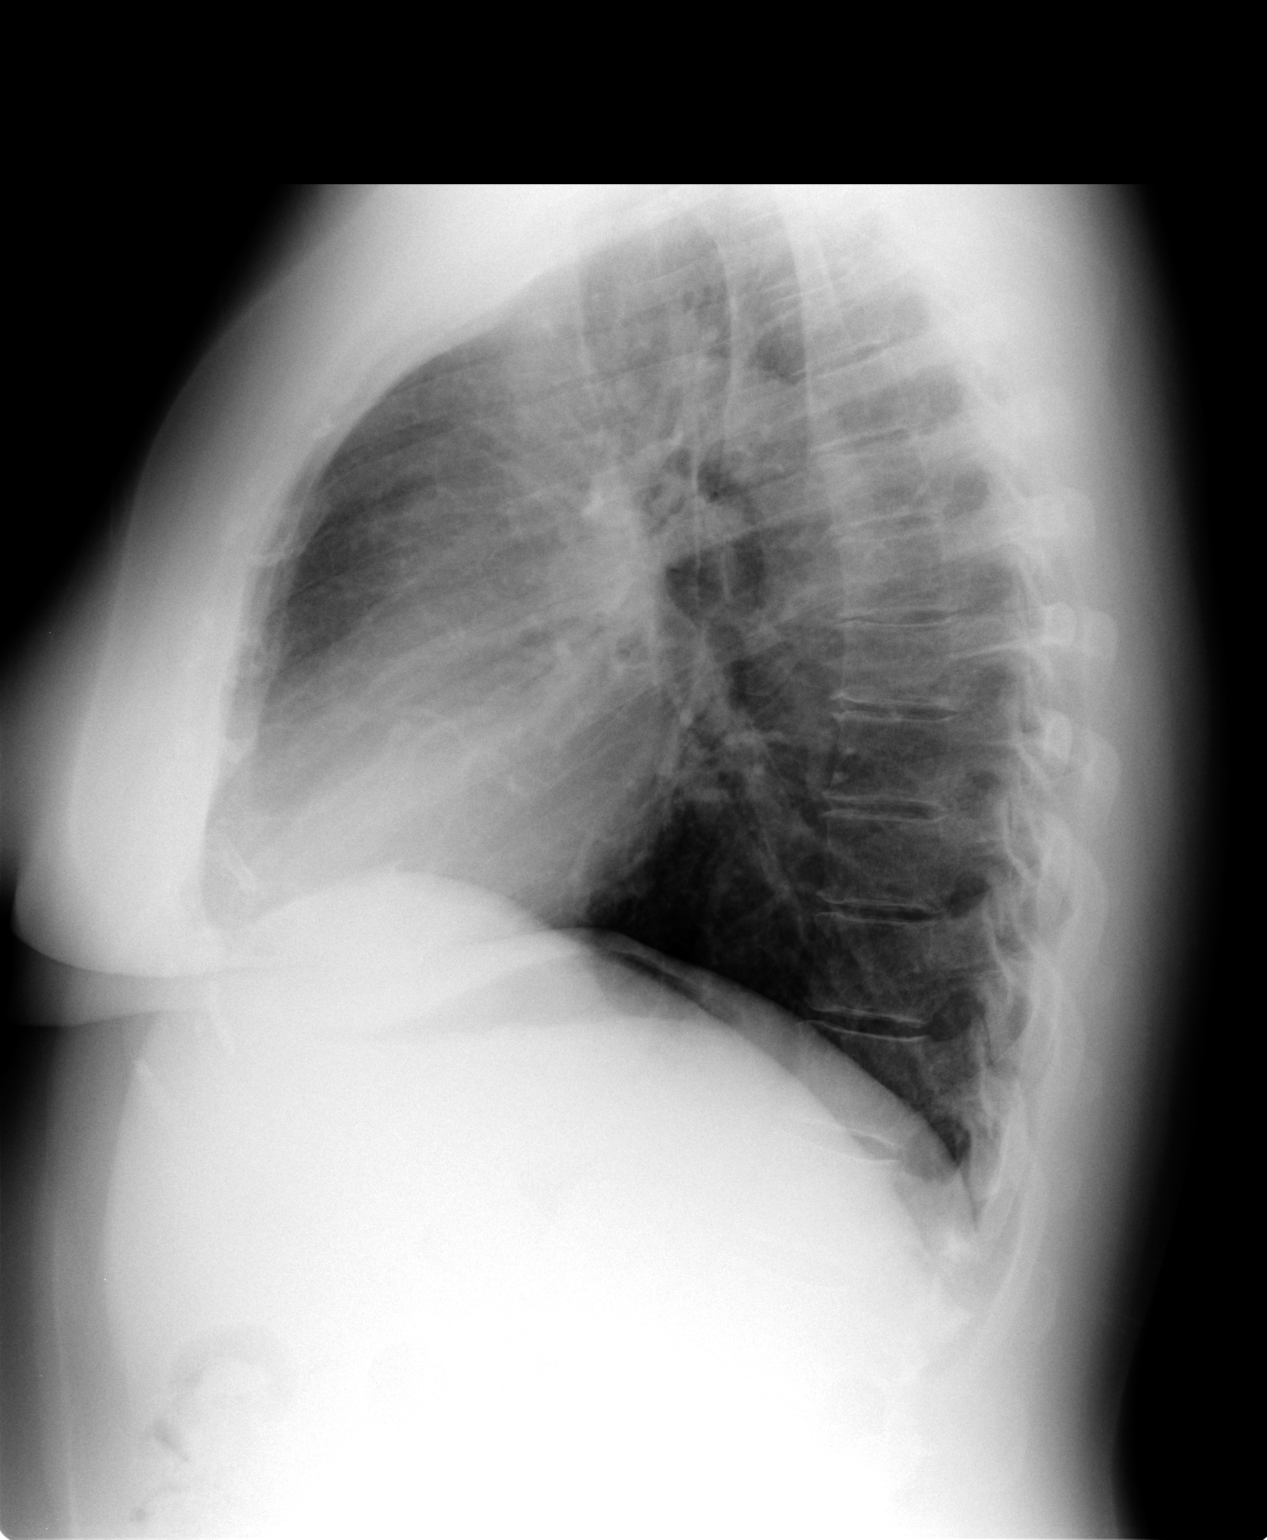

[2 of 2 positions shown; findings below may reference images not displayed]

FINDINGS: Mediastinum hilar structures normal. Lungs are clear. Heart size
normal. No pleural effusion or pneumothorax.
IMPRESSION: No acute cardiopulmonary disease.

## 2021-12-29 ENCOUNTER — Other Ambulatory Visit: Payer: Self-pay | Admitting: Podiatry

## 2021-12-29 ENCOUNTER — Ambulatory Visit: Payer: Managed Care, Other (non HMO) | Admitting: Podiatry

## 2021-12-29 ENCOUNTER — Ambulatory Visit (INDEPENDENT_AMBULATORY_CARE_PROVIDER_SITE_OTHER): Payer: Managed Care, Other (non HMO)

## 2021-12-29 DIAGNOSIS — M79671 Pain in right foot: Secondary | ICD-10-CM

## 2021-12-29 DIAGNOSIS — M76821 Posterior tibial tendinitis, right leg: Secondary | ICD-10-CM

## 2021-12-29 DIAGNOSIS — M79672 Pain in left foot: Secondary | ICD-10-CM

## 2021-12-29 DIAGNOSIS — M19071 Primary osteoarthritis, right ankle and foot: Secondary | ICD-10-CM | POA: Diagnosis not present

## 2021-12-29 MED ORDER — METHYLPREDNISOLONE 4 MG PO TBPK: ORAL_TABLET | ORAL | 0 refills | Status: AC

## 2021-12-29 MED ORDER — MELOXICAM 15 MG PO TABS: 15.0000 mg | ORAL_TABLET | Freq: Every day | ORAL | 1 refills | Status: DC

## 2021-12-29 MED ORDER — BETAMETHASONE SOD PHOS & ACET 6 (3-3) MG/ML IJ SUSP
3.0000 mg | Freq: Once | INTRAMUSCULAR | Status: AC
  Administered 2021-12-29: 3 mg via INTRA_ARTICULAR
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
## 2022-01-26 ENCOUNTER — Ambulatory Visit: Payer: Managed Care, Other (non HMO) | Admitting: Podiatry

## 2022-03-26 ENCOUNTER — Other Ambulatory Visit: Payer: Self-pay | Admitting: Podiatry

## 2022-05-03 ENCOUNTER — Ambulatory Visit: Payer: Managed Care, Other (non HMO) | Admitting: Podiatry

## 2022-05-03 ENCOUNTER — Ambulatory Visit: Payer: Managed Care, Other (non HMO)

## 2022-05-03 DIAGNOSIS — M779 Enthesopathy, unspecified: Secondary | ICD-10-CM

## 2022-05-03 DIAGNOSIS — M76821 Posterior tibial tendinitis, right leg: Secondary | ICD-10-CM | POA: Diagnosis not present

## 2022-05-03 MED ORDER — MELOXICAM 15 MG PO TABS: 15.0000 mg | ORAL_TABLET | Freq: Every day | ORAL | 1 refills | Status: AC
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
